# Patient Record
Sex: Female | Born: 1970 | Race: White | Hispanic: No | Smoking: Never smoker
Health system: Southern US, Community
[De-identification: ages and names within clinical notes are randomized; demographics above are authoritative.]

## PROBLEM LIST (undated history)

## (undated) DIAGNOSIS — E785 Hyperlipidemia, unspecified: Secondary | ICD-10-CM

## (undated) DIAGNOSIS — E039 Hypothyroidism, unspecified: Secondary | ICD-10-CM

## (undated) HISTORY — DX: Hyperlipidemia, unspecified: E78.5

## (undated) HISTORY — PX: CHOLECYSTECTOMY: SHX55

## (undated) HISTORY — PX: ENDOMETRIAL ABLATION: SHX621

## (undated) HISTORY — DX: Hypothyroidism, unspecified: E03.9

---

## 1998-09-27 ENCOUNTER — Other Ambulatory Visit: Admission: RE | Admit: 1998-09-27 | Discharge: 1998-09-27 | Payer: Self-pay | Admitting: Obstetrics and Gynecology

## 1999-08-06 ENCOUNTER — Emergency Department (HOSPITAL_COMMUNITY): Admission: EM | Admit: 1999-08-06 | Discharge: 1999-08-06 | Payer: Self-pay | Admitting: Emergency Medicine

## 1999-10-24 ENCOUNTER — Other Ambulatory Visit: Admission: RE | Admit: 1999-10-24 | Discharge: 1999-10-24 | Payer: Self-pay | Admitting: Physical Therapy

## 2000-05-17 ENCOUNTER — Inpatient Hospital Stay (HOSPITAL_COMMUNITY): Admission: AD | Admit: 2000-05-17 | Discharge: 2000-05-17 | Payer: Self-pay | Admitting: Obstetrics and Gynecology

## 2000-05-24 ENCOUNTER — Inpatient Hospital Stay (HOSPITAL_COMMUNITY): Admission: AD | Admit: 2000-05-24 | Discharge: 2000-05-27 | Payer: Self-pay | Admitting: Obstetrics and Gynecology

## 2000-06-14 ENCOUNTER — Encounter: Admission: RE | Admit: 2000-06-14 | Discharge: 2000-06-28 | Payer: Self-pay | Admitting: Obstetrics and Gynecology

## 2000-06-25 ENCOUNTER — Other Ambulatory Visit: Admission: RE | Admit: 2000-06-25 | Discharge: 2000-06-25 | Payer: Self-pay | Admitting: Obstetrics and Gynecology

## 2001-08-12 ENCOUNTER — Other Ambulatory Visit: Admission: RE | Admit: 2001-08-12 | Discharge: 2001-08-12 | Payer: Self-pay | Admitting: Obstetrics and Gynecology

## 2002-08-19 ENCOUNTER — Other Ambulatory Visit: Admission: RE | Admit: 2002-08-19 | Discharge: 2002-08-19 | Payer: Self-pay | Admitting: Obstetrics and Gynecology

## 2003-08-21 ENCOUNTER — Other Ambulatory Visit: Admission: RE | Admit: 2003-08-21 | Discharge: 2003-08-21 | Payer: Self-pay | Admitting: Obstetrics and Gynecology

## 2004-02-21 ENCOUNTER — Inpatient Hospital Stay (HOSPITAL_COMMUNITY): Admission: AD | Admit: 2004-02-21 | Discharge: 2004-02-21 | Payer: Self-pay | Admitting: Obstetrics and Gynecology

## 2004-02-27 ENCOUNTER — Inpatient Hospital Stay (HOSPITAL_COMMUNITY): Admission: AD | Admit: 2004-02-27 | Discharge: 2004-03-02 | Payer: Self-pay | Admitting: Obstetrics and Gynecology

## 2004-03-03 ENCOUNTER — Encounter: Admission: RE | Admit: 2004-03-03 | Discharge: 2004-04-02 | Payer: Self-pay | Admitting: Obstetrics and Gynecology

## 2004-03-07 ENCOUNTER — Inpatient Hospital Stay (HOSPITAL_COMMUNITY): Admission: AD | Admit: 2004-03-07 | Discharge: 2004-03-07 | Payer: Self-pay | Admitting: Obstetrics and Gynecology

## 2004-04-04 ENCOUNTER — Other Ambulatory Visit: Admission: RE | Admit: 2004-04-04 | Discharge: 2004-04-04 | Payer: Self-pay | Admitting: Obstetrics and Gynecology

## 2004-04-06 ENCOUNTER — Encounter: Admission: RE | Admit: 2004-04-06 | Discharge: 2004-04-06 | Payer: Self-pay | Admitting: Obstetrics and Gynecology

## 2004-08-05 ENCOUNTER — Encounter: Admission: RE | Admit: 2004-08-05 | Discharge: 2004-08-05 | Payer: Self-pay | Admitting: Obstetrics and Gynecology

## 2006-01-31 ENCOUNTER — Other Ambulatory Visit: Admission: RE | Admit: 2006-01-31 | Discharge: 2006-01-31 | Payer: Self-pay | Admitting: Obstetrics and Gynecology

## 2009-03-26 ENCOUNTER — Encounter: Admission: RE | Admit: 2009-03-26 | Discharge: 2009-03-26 | Payer: Self-pay | Admitting: Obstetrics and Gynecology

## 2010-05-06 ENCOUNTER — Ambulatory Visit (HOSPITAL_BASED_OUTPATIENT_CLINIC_OR_DEPARTMENT_OTHER): Admission: RE | Admit: 2010-05-06 | Discharge: 2010-05-06 | Payer: Self-pay | Admitting: Obstetrics and Gynecology

## 2010-05-06 ENCOUNTER — Ambulatory Visit: Payer: Self-pay | Admitting: Diagnostic Radiology

## 2011-02-24 NOTE — Op Note (Signed)
NAME:  Terri [name], Terri [name]                       ACCOUNT NO.:  [id number]   MEDICAL RECORD NO.:  [id number]                   PATIENT TYPE:  INP   LOCATION:  9112                                 FACILITY:  [facility]   PHYSICIAN:  Dineen Kid. Rana Snare, M.D.                 DATE OF BIRTH:  October 14, 1970   DATE OF PROCEDURE:  02/28/2004  DATE OF DISCHARGE:                                 OPERATIVE REPORT   PREOPERATIVE DIAGNOSES:  1. Intrauterine pregnancy at 39 weeks.  2. Arrest of dilation.  3. Occiput posterior presentation.   POSTOPERATIVE DIAGNOSES:  1. Intrauterine pregnancy at 39 weeks.  2. Arrest of dilation.  3. Occiput posterior presentation.   PROCEDURE:  Primary low segment transverse cesarean section.   SURGEON:  Dineen Kid. Rana Snare, M.D.   ANESTHESIA:  Epidural.   INDICATIONS:  Ms. Baine presented for amniotomy due to history of rapid  labor and group B strep positive and favorable cervix.  She rapidly  progressed to 8 cm, had arrest of dilation despite adequate contractions,  did not progress beyond this, was in the right occiput posterior  presentation at the -1 station at 8 cm.  Proceeded with primary low segment  transverse cesarean section for arrest of dilation.   FINDINGS AT THE TIME OF SURGERY:  A viable female infant, Apgars 8 and 9,  weight 9 pounds 2 ounces, pH arterial 7.36.   DESCRIPTION OF PROCEDURE:  After adequate analgesia, the patient was placed  in the supine position with left lateral tilt.  She was sterilely prepped  and draped, the bladder was sterilely drained.  A Pfannenstiel skin incision  was made two fingerbreadths from the pubic symphysis, taken down sharply to  fascia, which was incised transversely, extended superiorly and inferiorly  off the bellies of the rectus muscle, which were separated sharply in the  midline.  The peritoneum was entered sharply, a bladder flap was created and  placed behind the bladder blade.  A low segment myotomy incision was  made  down to the infant's vertex.  The infant was then delivered, cord clamped,  cut, and handed to the pediatricians for resuscitation.  Cord blood was  obtained, placenta extracted manually.  The uterus was exteriorized, wiped  clean with a dry lap.  The myotomy incision was closed in two layers, the  first being a running locking layer, the second being an imbricating layer  of 0 Monocryl suture.  The uterus was placed back in the peritoneal cavity  and after a copious amount of irrigation, adequate hemostasis was assured.  The peritoneum was closed with 0 Monocryl, rectus muscle plicated in the  midline, irrigation was applied, and after adequate hemostasis the fascia  was closed with #1 Vicryl in a running fashion.  Irrigation was applied and  after  adequate hemostasis, the skin was stapled, Steri-Strips applied.  The  patient tolerated the procedure well and was stable  on transfer to the  recovery room.  The sponge, needle, and instrument count was normal x3.  The  patient received 1 g of Cefoxitin after delivery of the placenta.                                               Dineen Kid Rana Snare, M.D.    DCL/MEDQ  D:  02/28/2004  T:  02/29/2004  Job:  [id number]

## 2011-02-24 NOTE — Discharge Summary (Signed)
NAME:  Terri [name], Terri [name]                       ACCOUNT NO.:  [id number]   MEDICAL RECORD NO.:  [id number]                   PATIENT TYPE:  INP   LOCATION:  9112                                 FACILITY:  [facility]   PHYSICIAN:  Duke Salvia. Marcelle Overlie, M.D.            DATE OF BIRTH:  Jul 16, 1971   DATE OF ADMISSION:  02/27/2004  DATE OF DISCHARGE:  03/02/2004                                 DISCHARGE SUMMARY   ADMITTING DIAGNOSES:  1. Intrauterine pregnancy at 39-and-a-half weeks estimated gestational age.  2. Induction of labor secondary to history of rapid labor.   DISCHARGE DIAGNOSES:  1. Status post low transverse cesarean section secondary to arrest of     dilatation and occiput posterior.  2. Viable female infant.   PROCEDURE:  Primary low transverse cesarean section.   REASON FOR ADMISSION:  Please see written H&P.   HOSPITAL COURSE:  The patient was a 40 year old gravida 2 para 1 that was  admitted to [facility] at 39-and-a-half weeks estimated  gestational age for an induction of labor.  The patient had history of a  rapid labor and she was also known to be positive for group B beta strep  with a favorable cervix.  On admission fetal heart tones were in the 140s  with accelerations.  Contractions were noted to be every 5-15 minutes.  Cervix was dilated to 3 cm, 50% effaced, vertex at a -3 station.  Artificial  rupture of membranes was performed without difficulty.  IV antibiotics were  given prophylactically for positive group B beta strep.  The patient did  progress to 8 cm but had an arrest of dilatation despite adequate  contractions and did not progress beyond this.  The vertex was noted to be  right occiput posterior at a -1 station.  The decision was made to proceed  with a primary low transverse cesarean section for arrest of dilatation.  The patient was then transferred to the operating room where epidural was  dosed to an adequate surgical level.  A low  transverse incision was made  with the delivery of a viable female infant weighing 9 pounds 2 ounces with  Apgars of 8 at one minute and 9 at five minutes.  Umbilical cord pH was  7.36.  The patient tolerated the procedure well and was taken to the  recovery room in stable condition.  On postoperative day #1 vital signs were  stable.  Fundus was firm and nontender.  She had good return of bowel  function.  Abdominal dressing was noted to be clean, dry, and intact.  Labs  revealed hemoglobin of 8.8.  The patient was started on some ferrous sulfate  when tolerating a regular diet.  On postoperative day #2 the patient was  afebrile, vital signs were stable.  Abdominal dressing had been removed  revealing an incision that was clean, dry, and intact.  The patient was  tolerating a regular  diet.  She was ambulating without assistance.  On  postoperative day #3 the patient was without complaint.  Vital signs were  stable; she was afebrile.  Incision was clean, dry, and intact.  Staples  were removed and the patient was discharged home.   CONDITION ON DISCHARGE:  Good.   DIET:  Regular as tolerated.   ACTIVITY:  No heavy lifting, no driving x2 weeks, no vaginal entry.   FOLLOW-UP:  The patient is to follow up in the office in 1-2 weeks for an  incision check.  She is to call for temperature greater than 100 degrees,  persistent nausea and vomiting, heavy vaginal bleeding, and/or redness or  drainage from the incisional site.   DISCHARGE MEDICATIONS:  1. Percocet 5/325 #30 one p.o. q.4-6h. p.r.n.  2. Motrin 600 mg q.6h.  3. Prenatal vitamins one p.o. daily.  4. Colace one p.o. daily p.r.n.  5. Ferrous sulfate 325 mg one p.o. b.i.d.     Julio Sicks, N.P.                        Richard M. Marcelle Overlie, M.D.    CC/MEDQ  D:  03/23/2004  T:  03/23/2004  Job:  [id number]

## 2011-03-22 ENCOUNTER — Other Ambulatory Visit: Payer: Self-pay | Admitting: Obstetrics and Gynecology

## 2011-03-22 DIAGNOSIS — N644 Mastodynia: Secondary | ICD-10-CM

## 2011-03-28 ENCOUNTER — Other Ambulatory Visit: Payer: Self-pay | Admitting: Obstetrics and Gynecology

## 2011-03-28 ENCOUNTER — Ambulatory Visit
Admission: RE | Admit: 2011-03-28 | Discharge: 2011-03-28 | Disposition: A | Discharge status: Home / Self Care | Payer: BC Managed Care – PPO | Source: Ambulatory Visit | Attending: Obstetrics and Gynecology | Admitting: Obstetrics and Gynecology

## 2011-03-28 DIAGNOSIS — N644 Mastodynia: Secondary | ICD-10-CM

## 2012-03-18 ENCOUNTER — Other Ambulatory Visit: Payer: Self-pay | Admitting: Obstetrics and Gynecology

## 2012-03-18 DIAGNOSIS — Z1231 Encounter for screening mammogram for malignant neoplasm of breast: Secondary | ICD-10-CM

## 2012-03-28 ENCOUNTER — Ambulatory Visit (HOSPITAL_BASED_OUTPATIENT_CLINIC_OR_DEPARTMENT_OTHER)
Admission: RE | Admit: 2012-03-28 | Discharge: 2012-03-28 | Disposition: A | Discharge status: Home / Self Care | Payer: BC Managed Care – PPO | Source: Ambulatory Visit | Attending: Obstetrics and Gynecology | Admitting: Obstetrics and Gynecology

## 2012-03-28 DIAGNOSIS — Z1231 Encounter for screening mammogram for malignant neoplasm of breast: Secondary | ICD-10-CM

## 2012-05-07 ENCOUNTER — Other Ambulatory Visit: Payer: Self-pay | Admitting: Family Medicine

## 2012-05-07 DIAGNOSIS — N6310 Unspecified lump in the right breast, unspecified quadrant: Secondary | ICD-10-CM

## 2012-05-10 ENCOUNTER — Other Ambulatory Visit: Payer: BC Managed Care – PPO

## 2012-05-16 ENCOUNTER — Ambulatory Visit
Admission: RE | Admit: 2012-05-16 | Discharge: 2012-05-16 | Disposition: A | Discharge status: Home / Self Care | Payer: BC Managed Care – PPO | Source: Ambulatory Visit | Attending: Family Medicine | Admitting: Family Medicine

## 2012-05-16 DIAGNOSIS — N6310 Unspecified lump in the right breast, unspecified quadrant: Secondary | ICD-10-CM

## 2012-08-01 ENCOUNTER — Other Ambulatory Visit (HOSPITAL_COMMUNITY): Payer: Self-pay | Admitting: Obstetrics and Gynecology

## 2012-08-01 DIAGNOSIS — R1031 Right lower quadrant pain: Secondary | ICD-10-CM

## 2012-08-06 ENCOUNTER — Ambulatory Visit (HOSPITAL_COMMUNITY)
Admission: RE | Admit: 2012-08-06 | Discharge: 2012-08-06 | Disposition: A | Discharge status: Home / Self Care | Payer: BC Managed Care – PPO | Source: Ambulatory Visit | Attending: Obstetrics and Gynecology | Admitting: Obstetrics and Gynecology

## 2012-08-06 ENCOUNTER — Ambulatory Visit (HOSPITAL_COMMUNITY): Payer: BC Managed Care – PPO

## 2012-08-06 DIAGNOSIS — J9819 Other pulmonary collapse: Secondary | ICD-10-CM | POA: Insufficient documentation

## 2012-08-06 DIAGNOSIS — R1031 Right lower quadrant pain: Secondary | ICD-10-CM | POA: Insufficient documentation

## 2012-08-06 MED ORDER — IOHEXOL 300 MG/ML  SOLN
100.0000 mL | Freq: Once | INTRAMUSCULAR | Status: AC | PRN
  Administered 2012-08-06: 100 mL via INTRAVENOUS

## 2014-02-14 IMAGING — CT CT ABD-PELV W/ CM
1 of 3 series · 14 of 32 positions shown, 19 images · IV contrast (OMNIPAQUE)
Comparison: None

CLINICAL DATA: Right lower quadrant abdominal pain.

CT ABDOMEN AND PELVIS WITH CONTRAST
TECHNIQUE: Multidetector CT imaging of the abdomen and pelvis was
performed following the standard protocol during bolus
administration of intravenous contrast.
Contrast: 100mL OMNIPAQUE IOHEXOL 300 MG/ML  SOLN

[Series 2: routine abdomen/pelvis with · axial · 0.79mm/px · z∈[-622,-222]mm · 14 of 92 slices shown, 19 images]
[im 6/92  soft-tissue]
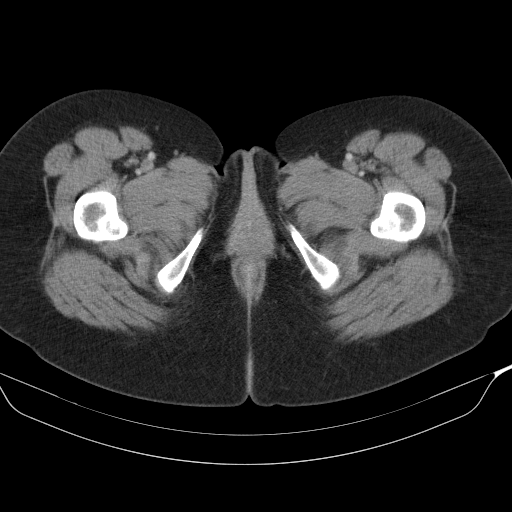
[im 6/92  bone]
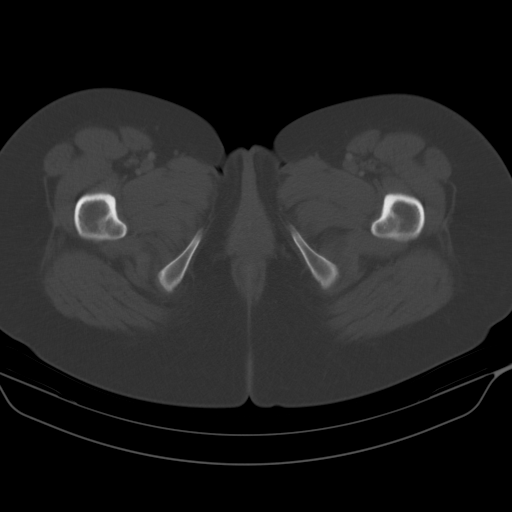
[im 11/92  soft-tissue]
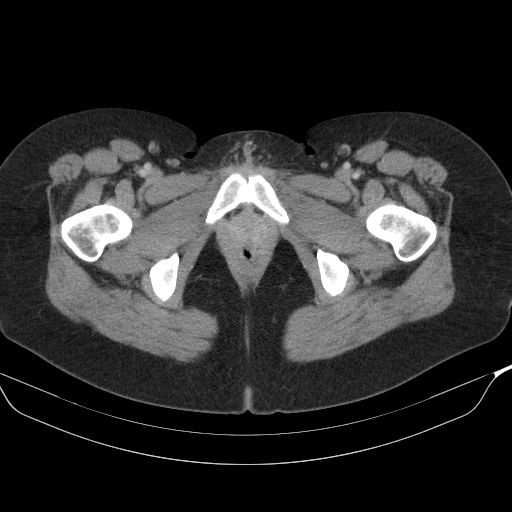
[im 21/92  soft-tissue]
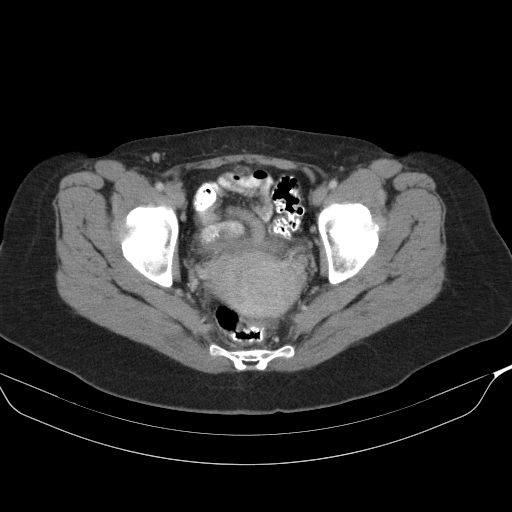
[im 26/92  soft-tissue]
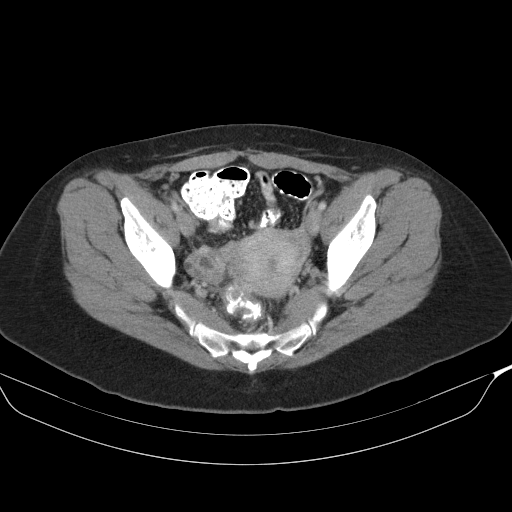
[im 31/92  soft-tissue]
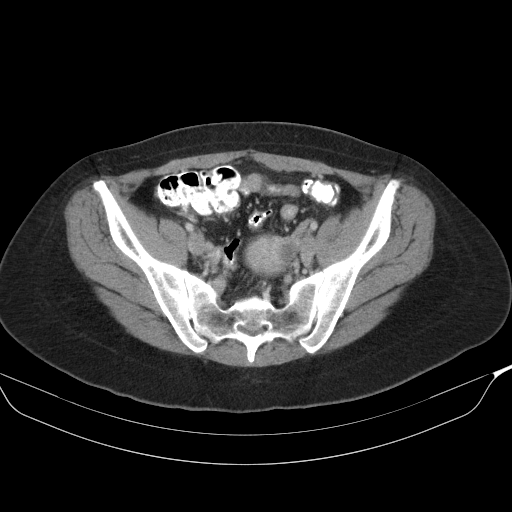
[im 41/92  soft-tissue]
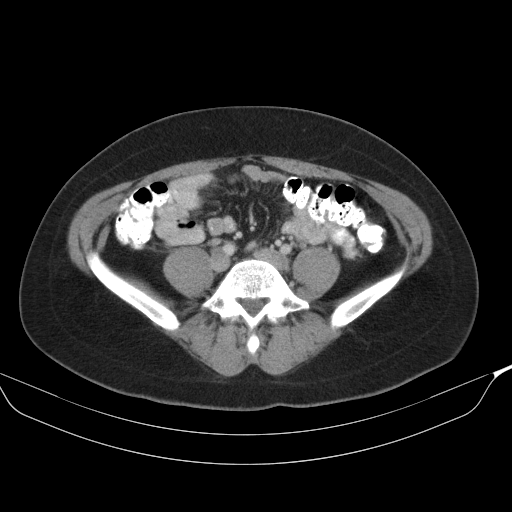
[im 46/92  soft-tissue]
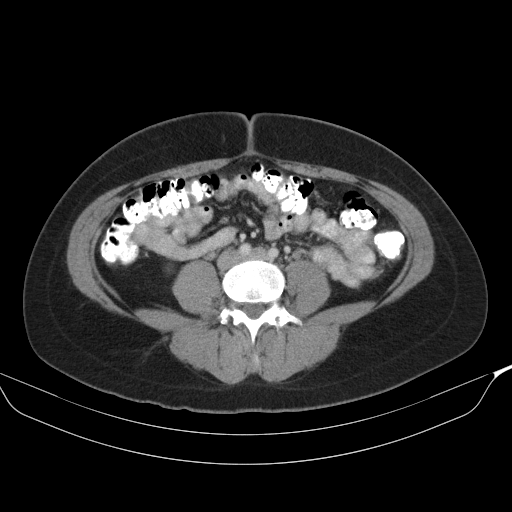
[im 51/92  soft-tissue]
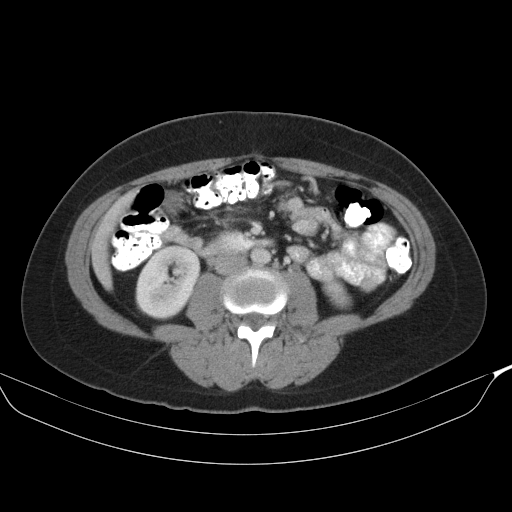
[im 61/92  soft-tissue]
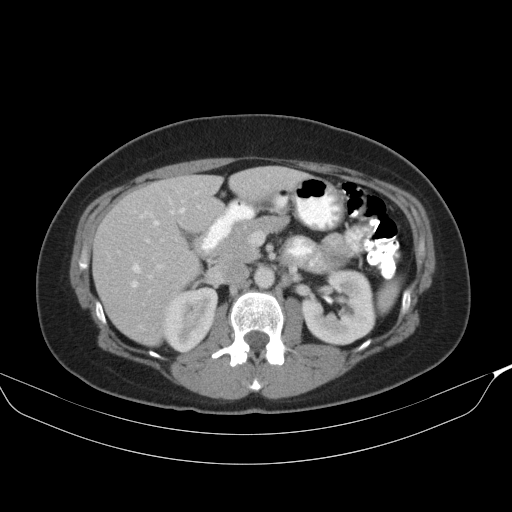
[im 61/92  bone]
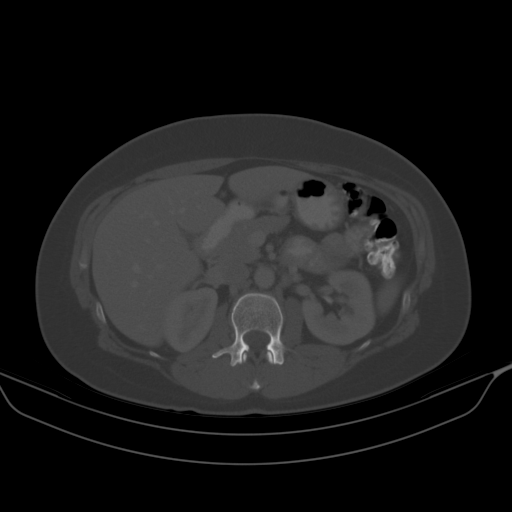
[im 66/92  soft-tissue]
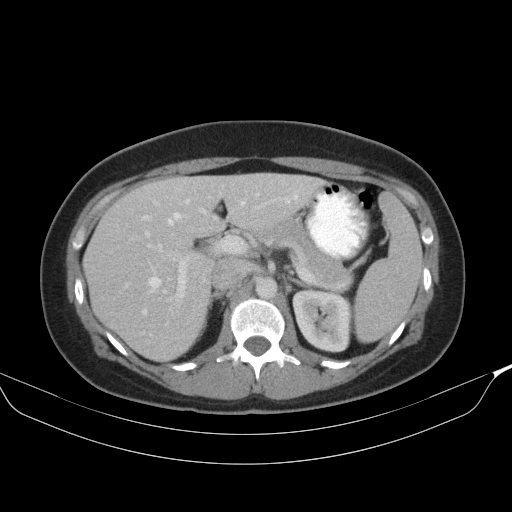
[im 71/92  soft-tissue]
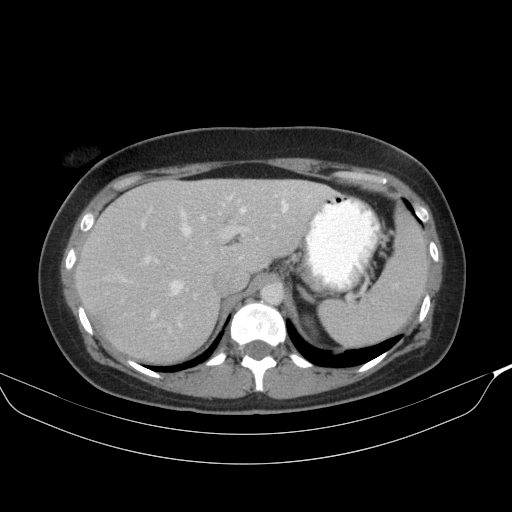
[im 71/92  lung]
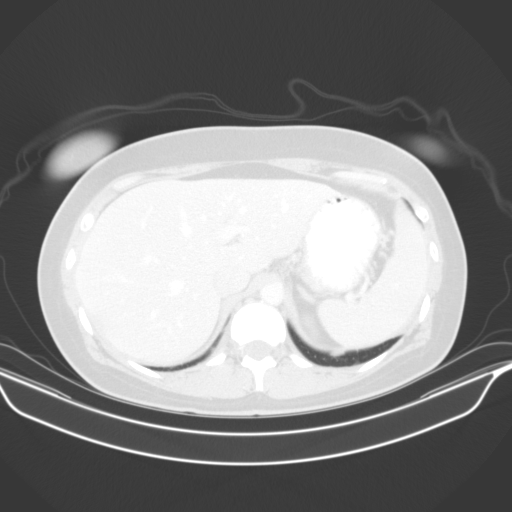
[im 76/92  lung]
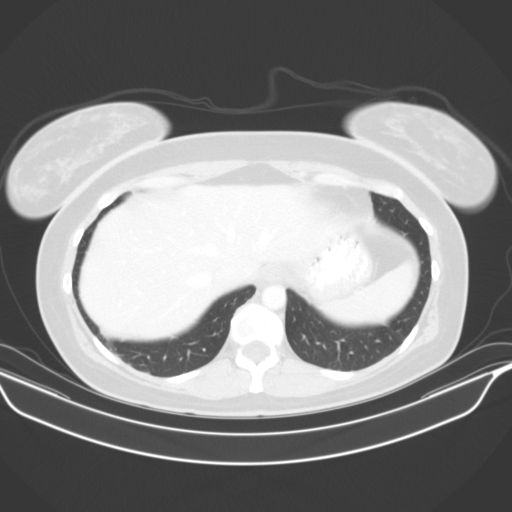
[im 81/92  soft-tissue]
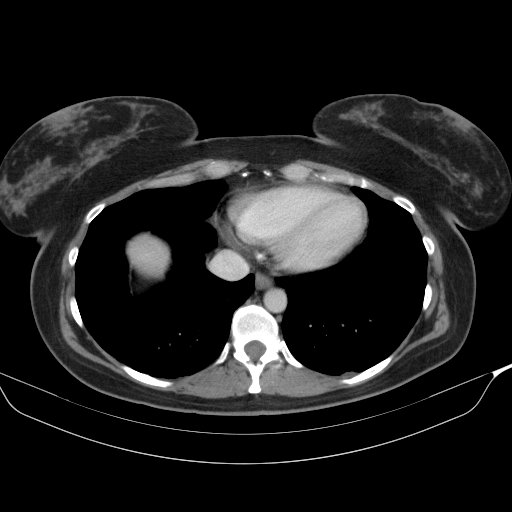
[im 81/92  lung]
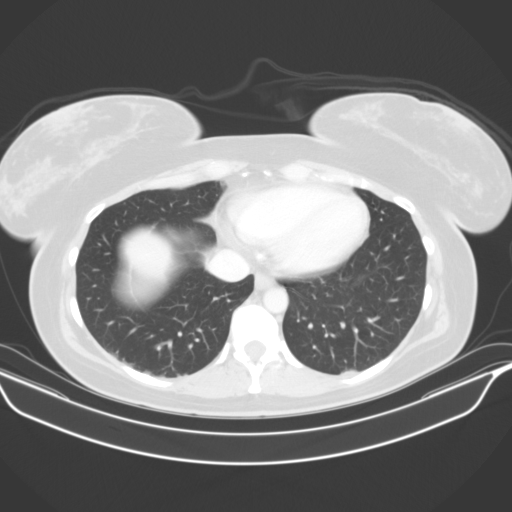
[im 86/92  soft-tissue]
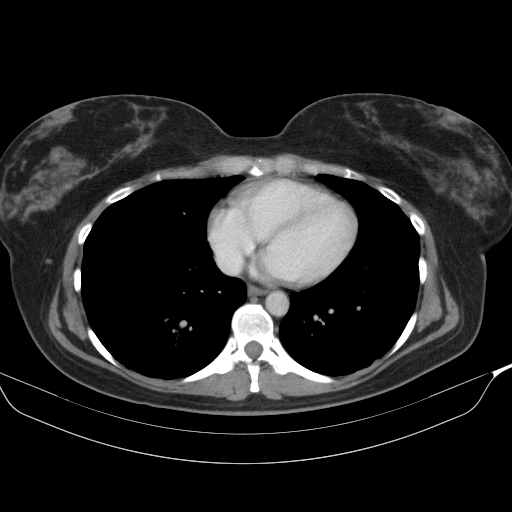
[im 86/92  lung]
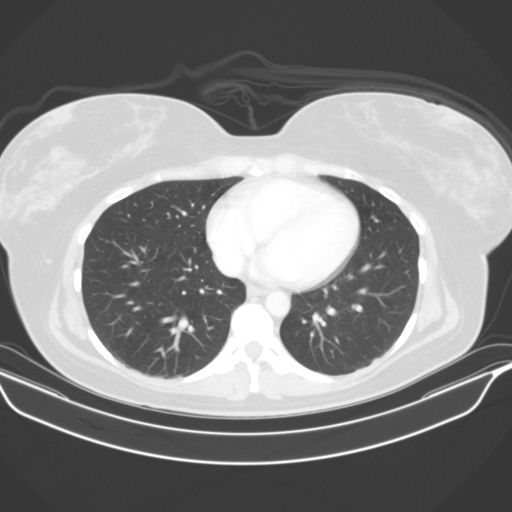

[14 of 32 positions shown; findings below may reference images not displayed]

FINDINGS: The lung bases are clear.  Minimal dependent subpleural
atelectasis.  The heart is normal in size.  No pericardial
effusion.  No hiatal hernia.

The liver is normal.  No focal lesions or intrahepatic biliary
dilatation.  The portal and hepatic veins are patent.  The
gallbladder is normal.  No common bile duct dilatation.  The
pancreas is normal.  The spleen is normal in size.  No focal
lesions.  The adrenal glands and kidneys are normal.

The stomach, duodenum, small bowel and colon are unremarkable.  The
terminal ileum is normal.  The appendix is not identified for
certain but no findings to suggest appendicitis.  No mesenteric or
retroperitoneal mass or adenopathy.  Small scattered lymph nodes
are noted.  The aorta is normal in caliber.  The major branch
vessels are normal.

The uterus and ovaries are unremarkable.  Small bilateral ovarian
cysts are noted.  No pelvic mass, adenopathy or free pelvic fluid
collections.  No inguinal mass or hernia.

The bony structures are unremarkable.
IMPRESSION: Unremarkable CT examination of the abdomen/pelvis.  No acute
abdominal/pelvic findings, mass lesions or lymphadenopathy.

## 2016-02-11 DIAGNOSIS — E785 Hyperlipidemia, unspecified: Secondary | ICD-10-CM | POA: Insufficient documentation

## 2016-02-11 DIAGNOSIS — E039 Hypothyroidism, unspecified: Secondary | ICD-10-CM | POA: Insufficient documentation

## 2016-02-11 DIAGNOSIS — G4700 Insomnia, unspecified: Secondary | ICD-10-CM | POA: Insufficient documentation

## 2016-02-11 DIAGNOSIS — F419 Anxiety disorder, unspecified: Secondary | ICD-10-CM | POA: Insufficient documentation

## 2016-02-11 DIAGNOSIS — F411 Generalized anxiety disorder: Secondary | ICD-10-CM | POA: Insufficient documentation

## 2016-02-11 DIAGNOSIS — N6489 Other specified disorders of breast: Secondary | ICD-10-CM | POA: Insufficient documentation

## 2016-02-11 DIAGNOSIS — K219 Gastro-esophageal reflux disease without esophagitis: Secondary | ICD-10-CM | POA: Insufficient documentation

## 2016-02-11 DIAGNOSIS — M5412 Radiculopathy, cervical region: Secondary | ICD-10-CM | POA: Insufficient documentation

## 2016-02-14 DIAGNOSIS — E781 Pure hyperglyceridemia: Secondary | ICD-10-CM | POA: Insufficient documentation

## 2017-03-12 ENCOUNTER — Other Ambulatory Visit: Payer: Self-pay | Admitting: General Surgery

## 2018-04-04 LAB — HM MAMMOGRAPHY

## 2018-04-05 LAB — HM PAP SMEAR

## 2018-04-05 LAB — LAB REPORT - SCANNED
HM HIV Screening: NEGATIVE
HM Hepatitis Screen: NEGATIVE

## 2018-06-20 LAB — LAB REPORT - SCANNED
A1c: 5.4
EGFR: 76

## 2019-01-13 DIAGNOSIS — J302 Other seasonal allergic rhinitis: Secondary | ICD-10-CM | POA: Insufficient documentation

## 2019-05-21 LAB — HM MAMMOGRAPHY

## 2020-06-07 LAB — HM MAMMOGRAPHY

## 2020-06-08 LAB — LAB REPORT - SCANNED
A1c: 5.5
EGFR: 100

## 2021-08-17 LAB — HM MAMMOGRAPHY

## 2021-08-18 LAB — LAB REPORT - SCANNED: EGFR (Non-African Amer.): 103

## 2021-08-18 LAB — HEMOGLOBIN A1C: A1c: 5.4

## 2022-08-23 LAB — HM MAMMOGRAPHY

## 2022-08-24 LAB — LAB REPORT - SCANNED
A1c: 14.1
HM HIV Screening: NEGATIVE
HM Hepatitis Screen: NEGATIVE

## 2022-08-24 LAB — HM PAP SMEAR

## 2022-08-31 LAB — CBC
Basophils Absolute: 0
Eosinophil %: 1.6
HCT: 39 (ref 29–41)
HGB: 13.1
Lymphocyte %: 36.5
MCH: 31.7
MCV: 93.8 (ref 76–111)
MONO#: 0.5
Neutrophil %: 54.4
Platelet: 283
RBC: 4.14 (ref 3.87–5.11)
RDW: 13.2
WBC: 7
lymph#: 2.6

## 2022-08-31 LAB — LIPID PANEL
Cholesterol: 199
HDL: 51
LDL Chol Calc (NIH): 104
Triglycerides: 222 — AB (ref 40–160)
VLDL: 44 mg/dL

## 2022-08-31 LAB — VITAMIN D, 1,25 + 25-HYDROXY: Vitamin D, 1, 25-Dihydroxy: 43.6

## 2022-08-31 LAB — COMPLETE METABOLIC PANEL WITH GFR
Albumin: 3.8
Alkaline Phosphatase: 57
BUN: 10
CALCIUM: 9.5
Chloride: 101
Creat: 0.7
Glucose: 88
Potassium: 4.3
Total Protein: 6.3 — AB (ref 6.4–8.2)
eGFR: 103.8

## 2022-08-31 LAB — TSH+T3+FREE T4+T3 FREE
Free T3: 3.54
Free T4: 0.9
TSH: 3.12

## 2022-12-03 LAB — LIPID PANEL
Chol/HDL Ratio: 3.59
HDL: 51
LDL: 91
Non-HDL Cholesterol: 132

## 2022-12-03 LAB — TSH+T3+FREE T4+T3 FREE
Free T3: 3.19
Free T4: 1
TSH: 2.27

## 2022-12-03 LAB — COMPLETE METABOLIC PANEL WITHOUT GFR
ALT(SGPT): 17 (ref 3–30)
AST: 22
Albumin: 3.6
Alkaline Phosphatase: 67
BUN/Creatinine Ratio: 8
BUN: 6
CALCIUM: 8.7
CO2: 27
Chloride: 101
Creat: 0.8
Globulin: 3.2
Glucose: 104
Potassium: 4.2
Sodium: 139 (ref 137–147)
Total Bilirubin: 0.3
Total Protein: 6.8 (ref 6.4–8.2)
eGFR: 88.3

## 2022-12-03 LAB — VITAMIN B12: VITAMIN B12: 282

## 2023-02-07 ENCOUNTER — Encounter: Payer: Self-pay | Admitting: Family Medicine

## 2023-02-07 ENCOUNTER — Ambulatory Visit: Payer: BC Managed Care – PPO | Admitting: Family Medicine

## 2023-02-07 VITALS — BP 106/71 | HR 81 | Temp 98.0°F | Ht 63.0 in | Wt 153.8 lb

## 2023-02-07 DIAGNOSIS — E039 Hypothyroidism, unspecified: Secondary | ICD-10-CM | POA: Diagnosis not present

## 2023-02-07 DIAGNOSIS — Z7689 Persons encountering health services in other specified circumstances: Secondary | ICD-10-CM | POA: Insufficient documentation

## 2023-02-07 DIAGNOSIS — E785 Hyperlipidemia, unspecified: Secondary | ICD-10-CM

## 2023-02-07 DIAGNOSIS — E538 Deficiency of other specified B group vitamins: Secondary | ICD-10-CM | POA: Insufficient documentation

## 2023-02-07 DIAGNOSIS — L299 Pruritus, unspecified: Secondary | ICD-10-CM | POA: Diagnosis not present

## 2023-02-07 DIAGNOSIS — R202 Paresthesia of skin: Secondary | ICD-10-CM | POA: Insufficient documentation

## 2023-02-07 DIAGNOSIS — F419 Anxiety disorder, unspecified: Secondary | ICD-10-CM | POA: Diagnosis not present

## 2023-02-07 NOTE — Progress Notes (Signed)
New Patient Office Visit  Subjective    Patient ID: Terri [name], female    DOB: 09/01/1971  Age: 52 y.o. MRN: [id number]  CC:  Chief Complaint  Patient presents with   Establish Care   Tingling    Last month 4 episodes of itching and tingling that start in hands and goes up both arms to neck and face. She also states she had this feeling in her back. It wakes her up in the middle of the night.     HPI Terri [name] presents to establish care with this practice. She is new to me. In 2019, she was having GI/gallbladder issues with [facility]. She was not happy with care and switched to holistic provider. Sees Dr. Rana Snare GYN who has been taking care of thyroid and hormones. Exercises daily with good tolerance. No chest pain, no shortness of breath.   Itching and tingling:  Has had 4 episodes in the past month of itching and tingling that starts in the hands that radiates to arms and face that wakes her up at night. The first time it happened she felt extremely hot. Started after dropping heavy object onto right foot. When this starts it lasts for several minutes to one hour. Recently had an allergic reaction to soap that resulted in rash and itching. Took Benadryl once which did help. Does not think this is related to an allergic reaction. Denies respiratory issues and facial swelling, it is a painful tingling. Stress is not issue. She is retired now, in June. Much less stress. Has not identified any changes in environment. Last episode was last week.   Dr. Rana Snare has been monitoring her thyroid function and prescribing hormonal replacement. She is up to date on pap, mammogram, and colonoscopy. Will get records sent to this practice.   History reviewed.  HLD: on niacin which has helped. No longer on statin therapy.   Hypothyroid: on levothyroxine 50 mcg daily.   Anxiety: on Wellbutrin 150 mg, symptoms are well controlled. Reports symptoms are worse in the winter usually.   History of  low Vitamin B 12: on treatment. Will likely check levels in near future once results are sent from previous provider.   Taking multiple supplements.   Chart review per Care Everywhere:  Last visible labs done 12/23/17. She will get recent lab work sent to office. Coloscopy on 07/02/20: unable to verify results. Reports normal. Will get results sent.  08/23/22: had STD blood work done. Unable to verify results, Reports all normal.     Outpatient Encounter Medications as of 02/07/2023  Medication Sig   Ascorbic Acid (VITAMIN C) 1000 MG tablet Take 1,000 mg by mouth daily.   Bacillus Coagulans-Inulin (PROBIOTIC) 1-250 BILLION-MG CAPS Take by mouth.   buPROPion (WELLBUTRIN XL) 150 MG 24 hr tablet Take 150 mg by mouth daily.   calcium carbonate (OS-CAL) 1250 (500 Ca) MG chewable tablet Chew 1 tablet by mouth.   Calcium-Magnesium-Vitamin D (CITRACAL CALCIUM+D) 600-40-500 MG-MG-UNIT TB24 Take by mouth.   estradiol (ESTRACE) 2 MG tablet Take 2 mg by mouth daily.   Ferrous Fumarate (IRON) 18 MG TBCR Take by mouth.   levothyroxine (SYNTHROID) 50 MCG tablet Take 50 mcg by mouth daily before breakfast.   MAGNESIUM PO Take by mouth. 1350mg    Methylcobalamin (METHYL B-12) 5000 MCG CHEW Chew by mouth.   Multiple Vitamin (MULTIVITAMIN) tablet Take 1 tablet by mouth daily.   niacin 500 MG CR capsule Take 500 mg by mouth at bedtime.  progesterone (PROMETRIUM) 100 MG capsule Take 100 mg by mouth daily.   ALPRAZolam (XANAX) 0.5 MG tablet Take 0.5 mg by mouth at bedtime as needed. (Patient not taking: Reported on 02/07/2023)   No facility-administered encounter medications on file as of 02/07/2023.    Past Medical History:  Diagnosis Date   HLD (hyperlipidemia)    Hypothyroidism     Past Surgical History:  Procedure Laterality Date   CESAREAN SECTION     CHOLECYSTECTOMY      Family History  Problem Relation Age of Onset   Heart attack Father     Social History   Socioeconomic History   Marital  status: Divorced    Spouse name: Not on file   Number of children: 2   Years of education: Not on file   Highest education level: Not on file  Occupational History   Not on file  Tobacco Use   Smoking status: Never   Smokeless tobacco: Never  Substance and Sexual Activity   Alcohol use: Not Currently   Drug use: Never   Sexual activity: Yes    Partners: Male  Other Topics Concern   Not on file  Social History Narrative   Not on file   Social Determinants of Health   Financial Resource Strain: Not on file  Food Insecurity: Not on file  Transportation Needs: Not on file  Physical Activity: Not on file  Stress: Not on file  Social Connections: Not on file  Intimate Partner Violence: Not on file    Review of Systems  Respiratory:  Negative for shortness of breath.   Cardiovascular:  Negative for chest pain.  Gastrointestinal:  Negative for nausea and vomiting.  Neurological:  Positive for tingling. Negative for dizziness, weakness and headaches.        Objective    BP 106/71   Pulse 81   Temp 98 F (36.7 C) (Oral)   Ht 5\' 3"  (1.6 m)   Wt 153 lb 12.8 oz (69.8 kg)   LMP  (LMP Unknown)   SpO2 99%   BMI 27.24 kg/m   Physical Exam Vitals and nursing note reviewed.  Constitutional:      General: She is not in acute distress.    Appearance: Normal appearance.  Cardiovascular:     Rate and Rhythm: Regular rhythm.     Pulses: Normal pulses.     Heart sounds: Normal heart sounds.  Pulmonary:     Effort: Pulmonary effort is normal.     Breath sounds: Normal breath sounds.  Skin:    General: Skin is warm and dry.     Capillary Refill: Capillary refill takes less than 2 seconds.  Neurological:     General: No focal deficit present.     Mental Status: She is alert. Mental status is at baseline.  Psychiatric:        Mood and Affect: Mood normal.        Behavior: Behavior normal.        Thought Content: Thought content normal.        Judgment: Judgment normal.         Assessment & Plan:   Problem List Items Addressed This Visit     Anxiety disorder   Relevant Medications   ALPRAZolam (XANAX) 0.5 MG tablet   buPROPion (WELLBUTRIN XL) 150 MG 24 hr tablet   Hyperlipidemia   Hypothyroidism   Relevant Medications   levothyroxine (SYNTHROID) 50 MCG tablet   Pruritic condition   Establishing care  with new doctor, encounter for - Primary   Tingling sensation   Vitamin B 12 deficiency  Agrees with plan of care discussed.  Questions answered. She will get recent labs and medical records sent to this office. She will keep a journal of details of her day whenever she has episodes of itching and tingling. Will likely repeat Vitamin B 12 levels after results are received. She is currently on treatment for Vit B12 deficiency.  Next steps to be determined. Agrees to gather more data. She will follow-up in 4 weeks, sooner if needed.    Return in about 4 weeks (around 03/07/2023) for follow-up for tingling .   Novella Olive, FNP

## 2023-02-07 NOTE — Addendum Note (Signed)
Addended by: Janeece Riggers D on: 02/07/2023 01:58 PM   Modules accepted: Orders

## 2023-02-16 ENCOUNTER — Encounter: Payer: Self-pay | Admitting: Family Medicine

## 2023-02-16 ENCOUNTER — Other Ambulatory Visit: Payer: Self-pay | Admitting: Family Medicine

## 2023-02-26 ENCOUNTER — Telehealth: Payer: Self-pay | Admitting: Family Medicine

## 2023-02-26 NOTE — Telephone Encounter (Signed)
Patient called again to state that she needs her upcoming appointment to be with Dr. Wyline Mood because insurance states that they will not pay for a visit with PCP. Inquired with patient if insurance will cover a visit with Dr. Wyline Mood. Informed patient that if they will, a TOC request can be placed. Patient states that she needs to see a primary care physician. Attempted to explain to patient that both our providers are primary care physicians but insurance many not have them listed the same, to which patient hung up again. Katha Hamming

## 2023-02-26 NOTE — Telephone Encounter (Signed)
Patient called upset. Patient states that her insurance company has charged her an extra $70 dollars and she is required to pay it. Her insurance company states that she saw Dorena Bodo, who is a specialist in their system. Patient states front desk lied to her about her copay (card states $10 for PCP). Informed patient that provider is in network with [employer] (per Haskell Riling) and that the listing of the provider as a specialist is incorrect. Patient demanded we fix it and was advised that since billing has already been contacted, there is little this office can do. Patient hung up. Katha Hamming

## 2023-02-27 NOTE — Telephone Encounter (Signed)
Please see Pts issue she is having

## 2023-03-07 ENCOUNTER — Ambulatory Visit: Payer: BC Managed Care – PPO | Admitting: Family Medicine

## 2023-03-07 ENCOUNTER — Encounter: Payer: Self-pay | Admitting: Family Medicine

## 2023-03-07 VITALS — BP 119/79 | HR 78 | Temp 97.8°F | Ht 63.0 in | Wt 152.5 lb

## 2023-03-07 DIAGNOSIS — F32A Depression, unspecified: Secondary | ICD-10-CM | POA: Diagnosis not present

## 2023-03-07 DIAGNOSIS — L299 Pruritus, unspecified: Secondary | ICD-10-CM

## 2023-03-07 DIAGNOSIS — R202 Paresthesia of skin: Secondary | ICD-10-CM | POA: Diagnosis not present

## 2023-03-07 MED ORDER — CETIRIZINE HCL 10 MG PO TABS
10.0000 mg | ORAL_TABLET | Freq: Every day | ORAL | 11 refills | Status: DC
Start: 2023-03-07 — End: 2023-10-01

## 2023-03-07 NOTE — Progress Notes (Signed)
Established Patient Office Visit  Subjective   Patient ID: Terri [name], female    DOB: 1970/10/31  Age: 52 y.o. MRN: [id number]  Chief Complaint  Patient presents with   Tingling    Face started to itch 1 week ago at work. She describes it as "sunburned". She states since then it has decreased some. Her face feels dry and tight and she is having trouble finding lotions that don't "sting". She does have bumps that appear.   Depression    Increased Wellbutrin to 300 mg and complains of nausea and dry mouth, but does not want to taper back down.     HPI  Presents today to follow-up for tingling and itching that starts in the hands and goes up both arms and to neck and face. It had happened 4 times last month. Today she reports these symptoms have not been as bad as before, went 2 weeks without symptoms. Had feelings of exhaustion. Facial itching on 03/01/23 that continued all day, felt like  a "sunburn"- there is no visible rash.  On 5/25 face felt "tight" and felt dry but did not look dry. Applied thick night cream which helped with the tight feeling. Tried benadryl for symptoms that did not seem to help the itching.   Denies diet and medication changes that could account for pruritus.   Increased Wellbutrin to 300 mg and now reports nausea in morning, has been taking 300 mg at one time. Recommend taking 150 mg twice per day instead of 300 mg at once. Wellbutrin is prescribed by another provider, should discuss this with prescribing provider.   Review of Systems  Respiratory:  Negative for shortness of breath.   Cardiovascular:  Negative for chest pain.  Gastrointestinal:  Positive for nausea (changed dose of mediation). Negative for vomiting.  Skin:  Positive for itching.  Neurological:  Negative for dizziness, weakness and headaches.      Objective:     BP 119/79   Pulse 78   Temp 97.8 F (36.6 C) (Oral)   Ht 5\' 3"  (1.6 m)   Wt 152 lb 8 oz (69.2 kg)   LMP  (LMP Unknown)    SpO2 98%   BMI 27.01 kg/m  BP Readings from Last 3 Encounters:  03/07/23 119/79  02/07/23 106/71      Physical Exam Vitals and nursing note reviewed.  Constitutional:      Appearance: Normal appearance.  Cardiovascular:     Rate and Rhythm: Normal rate and regular rhythm.  Pulmonary:     Effort: Pulmonary effort is normal.     Breath sounds: Normal breath sounds.  Skin:    General: Skin is warm and dry.     Findings: No erythema or rash.  Neurological:     General: No focal deficit present.     Mental Status: She is alert. Mental status is at baseline.  Psychiatric:        Mood and Affect: Mood normal.        Behavior: Behavior normal.        Thought Content: Thought content normal.     Results for orders placed or performed in visit on 03/07/23  Lab report - scanned  Result Value Ref Range   EGFR (Non-African Amer.) 103   Hemoglobin A1c  Result Value Ref Range   A1c 5.4       The ASCVD Risk score (Arnett DK, et al., 2019) failed to calculate for the following reasons:  Cannot find a previous HDL lab   Cannot find a previous total cholesterol lab    Assessment & Plan:   Problem List Items Addressed This Visit     Pruritic condition - Primary   Relevant Medications   cetirizine (ZYRTEC) 10 MG tablet   Other Relevant Orders   B12 and Folate Panel   Ambulatory referral to Allergy   Tingling sensation   Relevant Medications   cetirizine (ZYRTEC) 10 MG tablet   Other Relevant Orders   B12 and Folate Panel   Ambulatory referral to Allergy   Depression  Will get labs today. No visible rash to assess. Symptoms occur intermittently. No shortness of breath or facial swelling. She will try cetirizine 10 mg daily while awaiting appointment with allergy specialist for further evaluation.   Recommend discussing Wellbutrin with prescribing provider about dose increase.   Return in about 23 weeks (around 08/15/2023) for cpe with labs .    Novella Olive,  FNP

## 2023-03-08 LAB — B12 AND FOLATE PANEL
Folate: >20 ng/mL (ref >3.0)
Vitamin B-12: >2000 pg/mL — ABNORMAL HIGH (ref 232–1245)

## 2023-03-13 ENCOUNTER — Other Ambulatory Visit: Payer: Self-pay | Admitting: Family Medicine

## 2023-03-13 LAB — COMPLETE METABOLIC PANEL WITH GFR
ALT(SGPT): 15 (ref 3–30)
AST: 19
CO2: 29
Globulin: 2.5
Sodium: 138 (ref 137–147)
Total Bilirubin: 0.5

## 2023-03-13 LAB — CBC
BASO%: 1 %
Eosinophil #: 0.1
MCHC: 33.8
MONO%: 7 %
MPV: 9.4 fL (ref 7.8–11.0)
NEUT#: 3.8

## 2023-03-13 LAB — LIPID PANEL
Chol/HDL Ratio: 3.9
Cholesterol: 183
LDL/HDL Ratio: 1.8
LDL/HDL Ratio: 2
Non-HDL Cholesterol: 148
Triglycerides: 204 — AB (ref 40–160)
VLDL: 41 mg/dL

## 2023-03-13 NOTE — Progress Notes (Signed)
Labs entered from 12/01/22

## 2023-03-13 NOTE — Progress Notes (Signed)
External labs entered from 08/30/22.

## 2023-04-18 ENCOUNTER — Encounter: Payer: Self-pay | Admitting: Internal Medicine

## 2023-04-18 ENCOUNTER — Ambulatory Visit: Payer: BC Managed Care – PPO | Admitting: Internal Medicine

## 2023-04-18 VITALS — BP 104/60 | HR 67 | Temp 97.7°F | Resp 16 | Ht 63.5 in | Wt 156.8 lb

## 2023-04-18 DIAGNOSIS — G629 Polyneuropathy, unspecified: Secondary | ICD-10-CM | POA: Diagnosis not present

## 2023-04-18 DIAGNOSIS — J302 Other seasonal allergic rhinitis: Secondary | ICD-10-CM | POA: Diagnosis not present

## 2023-04-18 DIAGNOSIS — L299 Pruritus, unspecified: Secondary | ICD-10-CM

## 2023-04-18 DIAGNOSIS — J3089 Other allergic rhinitis: Secondary | ICD-10-CM | POA: Diagnosis not present

## 2023-04-18 NOTE — Progress Notes (Signed)
New Patient Note  RE: Terri [name] MRN: [id number] DOB: Jun 26, 1971 Date of Office Visit: 04/18/2023  Consult requested by: Novella Olive, FNP Primary care provider: Novella Olive, FNP  Chief Complaint: Allergic Reaction (Pt states tingling starting in the fingers that travels to the arm, shoulders, neck, chest. Which turns into itching. Ongoing for about 6 months. Started severally, took benadryl. Can't use soap.)  History of Present Illness: I had the pleasure of seeing Terri [name] for initial evaluation at the [facility] on 04/18/2023. She is a 52 y.o. female, who is referred here by Novella Olive, FNP for the evaluation of pruritus.  History obtained from patient, chart review.  Rash: started 6 months ago, occurring intermittently, initially starting at night and would wake her up from pain or scratching.  Associates no skin changes or rashes  Denies other systemic symptoms including no respiratory, gastrointestinal or cardiovascular distress. Can only use goats milk based soap  Therapies tried: benadryl ( no benefit)  Potential triggers: unknown  Does not associate fever, joint pain, joint swelling, weight loss.  Lesions resolve in minues  No  bruising on resolution. No  recent illness. Describes symptoms as starting in her fingers and traveling up through her arms to her shoulders.  Lasted for minutes.  Unsure positioning changes symptoms.  She does feel like she has some weakness in her upper extremities.  She has not been seen by neurology.    Assessment and Plan: Terri [name] is a 52 y.o. female with: Pruritus  Seasonal and perennial allergic rhinitis  Neuropathy - Plan: Ambulatory referral to Neurology  Low suspicion symptoms are due to an allergic or histaminergic nature.  Sound more neuropathic.  Will refer to neurology. Plan: Patient Instructions  Neuropath/Pruritus  - Low suspicion this is due to to an allergic reaction -We will  refer you to neurology our admin team will help reach out to you to establish this appointment  Chronic Rhinitis Seasonal and Perennial Allergic: - allergy testing today: Positive to weed pollen and dust mite  - Prevention:  - allergen avoidance when possible - consider allergy shots as long term control of your symptoms by teaching your immune system to be more tolerant of your allergy triggers  - Symptom control: - Consider Nasal Steroid Spray: Best results if used daily. - Options include Flonase (fluticasone), Nasocort (triamcinolone), Nasonex (mometasome) 1- 2 sprays in each nostril daily.  - All can be purchased over-the-counter if not covered by insurance. - Consider Antihistamine: daily or daily as needed.   -Options include Zyrtec (Cetirizine) 10mg , Claritin (Loratadine) 10mg , Allegra (Fexofenadine) 180mg , or Xyzal (Levocetirinze) 5mg  - Can be purchased over-the-counter if not covered by insurance.  Follow up: as needed   Thank you so much for letting me partake in your care today.  Don't hesitate to reach out if you have any additional concerns!  Ferol Luz, MD  [facility] [place] [place]  Reducing Pollen Exposure  The American Academy of Allergy, Asthma and Immunology suggests the following steps to reduce your exposure to pollen during allergy seasons.    Do not hang sheets or clothing out to dry; pollen may collect on these items. Do not mow lawns or spend time around freshly cut grass; mowing stirs up pollen. Keep windows closed at night.  Keep car windows closed while driving. Minimize morning activities outdoors, a time when pollen counts are usually at their highest. Stay indoors as much as possible  when pollen counts or humidity is high and on windy days when pollen tends to remain in the air longer. Use air conditioning when possible.  Many air conditioners have filters that trap the pollen spores. Use a HEPA room air filter to remove pollen  form the indoor air you breathe.  DUST MITE AVOIDANCE MEASURES:  There are three main measures that need and can be taken to avoid house dust mites:  Reduce accumulation of dust in general -reduce furniture, clothing, carpeting, books, stuffed animals, especially in bedroom  Separate yourself from the dust -use pillow and mattress encasements (can be found at stores such as [employer], and [employer] or online) -avoid direct exposure to air condition flow -use a HEPA filter device, especially in the bedroom; you can also use a HEPA filter vacuum cleaner -wipe dust with a moist towel instead of a dry towel or broom when cleaning  Decrease mites and/or their secretions -wash clothing and linen and stuffed animals at highest temperature possible, at least every 2 weeks -stuffed animals can also be placed in a bag and put in a freezer overnight  Despite the above measures, it is impossible to eliminate dust mites or their allergen completely from your home.  With the above measures the burden of mites in your home can be diminished, with the goal of minimizing your allergic symptoms.  Success will be reached only when implementing and using all means together.   No orders of the defined types were placed in this encounter.  Lab Orders  No laboratory test(s) ordered today    Other allergy screening: Asthma: no Rhino conjunctivitis: yes: reports previous environmental testing due previous mold /cat exposure which was pan positive.  Took claritin regularly, but moved out house and symptoms resolved.  Will need AH in spring time if high pollen days.   Food allergy: no Medication allergy:  contact dermatitis to surgical tape and adhesives  Hymenoptera allergy: no Urticaria: no Eczema:no History of recurrent infections suggestive of immunodeficency: no  Diagnostics:   Skin Testing: Environmental allergy panel and select foods.  adequate controls  Results interpreted by myself and  discussed with patient/family.  Airborne Adult Perc - 04/18/23 1516     Time Antigen Placed 1516    Allergen Manufacturer Waynette Buttery    Location Back    Number of Test 55    Panel 1 Select    1. Control-Buffer 50% Glycerol Negative    2. Control-Histamine 4+    3. Bahia Negative    4. French Southern Territories Negative    5. Johnson Negative    6. Kentucky Blue Negative    7. Meadow Fescue Negative    8. Perennial Rye Negative    9. Timothy Negative    10. Ragweed Mix 4+    11. Cocklebur 4+    12. Plantain,  English 3+    13. Baccharis 4+    14. Dog Fennel 4+    16. Lamb's Quarters Negative    17. Sheep Sorrell 4+    18. Rough Pigweed 4+    19. Marsh Elder, Rough Negative    20. Mugwort, Common Negative    21. Box, Elder Negative    22. Cedar, red Negative    23. Sweet Gum Negative    24. Pecan Pollen Negative    25. Pine Mix Negative    26. Walnut, Black Pollen Negative    27. Red Mulberry Negative    28. Ash Mix Negative    29. Charletta Cousin  Mix Negative    30. Beech American Negative    31. Cottonwood, Guinea-Bissau Negative    32. Hickory, White Negative    33. Maple Mix Negative    34. Oak, Guinea-Bissau Mix Negative    35. Sycamore Eastern Negative    36. Alternaria Alternata Negative    37. Cladosporium Herbarum Negative    38. Aspergillus Mix Negative    39. Penicillium Mix Negative    40. Bipolaris Sorokiniana (Helminthosporium) Negative    41. Drechslera Spicifera (Curvularia) Negative    42. Mucor Plumbeus Negative    43. Fusarium Moniliforme Negative    44. Aureobasidium Pullulans (pullulara) Negative    45. Rhizopus Oryzae Negative    46. Botrytis Cinera Negative    47. Epicoccum Nigrum Negative    48. Phoma Betae Negative    49. Dust Mite Mix 4+    50. Cat Hair 10,000 BAU/ml Negative    51.  Dog Epithelia Negative    52. Mixed Feathers Negative    53. Horse Epithelia Negative    54. Cockroach, German Negative    55. Tobacco Leaf Negative             13 Food Perc - 04/18/23 1516        Test Information   Time Antigen Placed 1516    Allergen Manufacturer Waynette Buttery    Location Back    Number of allergen test 13    Food Select      Food   1. Peanut Negative    2. Soybean Negative    3. Wheat Negative    4. Sesame Negative    5. Milk, Cow Negative    6. Casein Negative    7. Egg White, Chicken Negative    8. Shellfish Mix Negative    9. Fish Mix Negative    10. Cashew Negative    11. Walnut Food Negative    12. Almond Negative    13. Hazelnut Negative             Past Medical History: Patient Active Problem List   Diagnosis Date Noted   Depression 03/07/2023   Pruritic condition 02/07/2023   Establishing care with new doctor, encounter for 02/07/2023   Tingling sensation 02/07/2023   Vitamin B 12 deficiency 02/07/2023   Seasonal allergies 01/13/2019   Pure hypertriglyceridemia 02/14/2016   Anxiety disorder 02/11/2016   Cervical radiculopathy 02/11/2016   Gastroesophageal reflux disease 02/11/2016   Hyperlipidemia 02/11/2016   Hypothyroidism 02/11/2016   Insomnia 02/11/2016   Pendulous breast 02/11/2016   Past Medical History:  Diagnosis Date   HLD (hyperlipidemia)    Hypothyroidism    Past Surgical History: Past Surgical History:  Procedure Laterality Date   CESAREAN SECTION     CHOLECYSTECTOMY     ENDOMETRIAL ABLATION     Medication List:  Current Outpatient Medications  Medication Sig Dispense Refill   Ascorbic Acid (VITAMIN C) 1000 MG tablet Take 1,000 mg by mouth daily.     Bacillus Coagulans-Inulin (PROBIOTIC) 1-250 BILLION-MG CAPS Take by mouth.     buPROPion (WELLBUTRIN XL) 150 MG 24 hr tablet Take 150 mg by mouth daily.     calcium carbonate (OS-CAL) 1250 (500 Ca) MG chewable tablet Chew 1 tablet by mouth.     Calcium-Magnesium-Vitamin D (CITRACAL CALCIUM+D) 600-40-500 MG-MG-UNIT TB24 Take by mouth.     cetirizine (ZYRTEC) 10 MG tablet Take 1 tablet (10 mg total) by mouth daily. 30 tablet 11   estradiol (ESTRACE) 2 MG  tablet Take 2 mg by mouth daily.     Ferrous Fumarate (IRON) 18 MG TBCR Take by mouth.     levothyroxine (SYNTHROID) 50 MCG tablet Take 50 mcg by mouth daily before breakfast.     MAGNESIUM PO Take by mouth. 1350mg      metroNIDAZOLE (METROCREAM) 0.75 % cream Apply topically 2 (two) times daily.     Multiple Vitamin (MULTIVITAMIN) tablet Take 1 tablet by mouth daily.     niacin 500 MG CR capsule Take 500 mg by mouth at bedtime.     progesterone (PROMETRIUM) 100 MG capsule Take 100 mg by mouth daily.     vitamin B-12 (CYANOCOBALAMIN) 500 MCG tablet Take 500 mcg by mouth daily.     No current facility-administered medications for this visit.   Allergies: Allergies  Allergen Reactions   Wound Dressing Adhesive Rash   Social History: Social History   Socioeconomic History   Marital status: Divorced    Spouse name: Not on file   Number of children: 2   Years of education: Not on file   Highest education level: Not on file  Occupational History   Not on file  Tobacco Use   Smoking status: Never    Passive exposure: Never   Smokeless tobacco: Never  Vaping Use   Vaping Use: Never used  Substance and Sexual Activity   Alcohol use: Yes    Comment: occas.   Drug use: Never   Sexual activity: Yes    Partners: Male  Other Topics Concern   Not on file  Social History Narrative   Not on file   Social Determinants of Health   Financial Resource Strain: Not on file  Food Insecurity: Not on file  Transportation Needs: Not on file  Physical Activity: Not on file  Stress: Not on file  Social Connections: Not on file   Lives in a single-family home that is 53 years old.  There are no roaches in the house and bed is 2 feet off the floor.  No dust mite precautions.  Not exposed to fumes, chemicals or dust.  There is a HEPA filter in the home and home [place] not near an interstate industrial area. Smoking: No exposure Occupation: Works as an Arts administrator and travel Advertising copywriter  History: Immunologist in the house: no Engineer, civil (consulting) in the family room: no Carpet in the bedroom: yes Heating: gas Cooling: central Pet: yes dog with access to bedroom  Family History: Family History  Problem Relation Age of Onset   Heart attack Father    Allergic rhinitis Son      ROS: All others negative except as noted per HPI.   Objective: BP 104/60   Pulse 67   Temp 97.7 F (36.5 C) (Temporal)   Resp 16   Ht 5' 3.5" (1.613 m)   Wt 156 lb 12.8 oz (71.1 kg)   SpO2 99%   BMI 27.34 kg/m  Body mass index is 27.34 kg/m.  General Appearance:  Alert, cooperative, no distress, appears stated age  Head:  Normocephalic, without obvious abnormality, atraumatic  Eyes:  Conjunctiva clear, EOM's intact  Nose: Nares normal, normal mucosa, no visible anterior polyps, and septum midline  Throat: Lips, tongue normal; teeth and gums normal, normal posterior oropharynx  Neck: Supple, symmetrical  Lungs:   clear to auscultation bilaterally, Respirations unlabored, no coughing  Heart:  regular rate and rhythm and no murmur, Appears well perfused  Extremities: No edema  Skin: Skin color, texture,  turgor normal, no rashes or lesions on visualized portions of skin  Neurologic: No gross deficits   The plan was reviewed with the patient/family, and all questions/concerned were addressed.  It was my pleasure to see Laureen today and participate in her care. Please feel free to contact me with any questions or concerns.  Sincerely,  Ferol Luz, MD Allergy & Immunology  [facility] office: [phone] [facility] office: [phone]

## 2023-04-18 NOTE — Patient Instructions (Addendum)
Neuropath/Pruritus  - Low suspicion this is due to to an allergic reaction -We will refer you to neurology our admin team will help reach out to you to establish this appointment  Chronic Rhinitis Seasonal and Perennial Allergic: - allergy testing today: Positive to weed pollen and dust mite  - Prevention:  - allergen avoidance when possible - consider allergy shots as long term control of your symptoms by teaching your immune system to be more tolerant of your allergy triggers  - Symptom control: - Consider Nasal Steroid Spray: Best results if used daily. - Options include Flonase (fluticasone), Nasocort (triamcinolone), Nasonex (mometasome) 1- 2 sprays in each nostril daily.  - All can be purchased over-the-counter if not covered by insurance. - Consider Antihistamine: daily or daily as needed.   -Options include Zyrtec (Cetirizine) 10mg , Claritin (Loratadine) 10mg , Allegra (Fexofenadine) 180mg , or Xyzal (Levocetirinze) 5mg  - Can be purchased over-the-counter if not covered by insurance.  Follow up: as needed   Thank you so much for letting me partake in your care today.  Don't hesitate to reach out if you have any additional concerns!  Ferol Luz, MD  [facility] [place] [place]  Reducing Pollen Exposure  The American Academy of Allergy, Asthma and Immunology suggests the following steps to reduce your exposure to pollen during allergy seasons.    Do not hang sheets or clothing out to dry; pollen may collect on these items. Do not mow lawns or spend time around freshly cut grass; mowing stirs up pollen. Keep windows closed at night.  Keep car windows closed while driving. Minimize morning activities outdoors, a time when pollen counts are usually at their highest. Stay indoors as much as possible when pollen counts or humidity is high and on windy days when pollen tends to remain in the air longer. Use air conditioning when possible.  Many air conditioners  have filters that trap the pollen spores. Use a HEPA room air filter to remove pollen form the indoor air you breathe.  DUST MITE AVOIDANCE MEASURES:  There are three main measures that need and can be taken to avoid house dust mites:  Reduce accumulation of dust in general -reduce furniture, clothing, carpeting, books, stuffed animals, especially in bedroom  Separate yourself from the dust -use pillow and mattress encasements (can be found at stores such as Bed, Bath, and Beyond or online) -avoid direct exposure to air condition flow -use a HEPA filter device, especially in the bedroom; you can also use a HEPA filter vacuum cleaner -wipe dust with a moist towel instead of a dry towel or broom when cleaning  Decrease mites and/or their secretions -wash clothing and linen and stuffed animals at highest temperature possible, at least every 2 weeks -stuffed animals can also be placed in a bag and put in a freezer overnight  Despite the above measures, it is impossible to eliminate dust mites or their allergen completely from your home.  With the above measures the burden of mites in your home can be diminished, with the goal of minimizing your allergic symptoms.  Success will be reached only when implementing and using all means together.

## 2023-04-19 NOTE — Addendum Note (Signed)
Addended by: Berna Bue on: 04/19/2023 11:55 AM   Modules accepted: Orders

## 2023-04-30 NOTE — Progress Notes (Signed)
Patient is scheduled to see Dr. Terrace Arabia on 07/09/2023. Patient is aware of this appt as they contacted the patient to get scheduled.

## 2023-05-25 ENCOUNTER — Encounter: Payer: Self-pay | Admitting: Family Medicine

## 2023-05-28 ENCOUNTER — Other Ambulatory Visit: Payer: Self-pay | Admitting: Family Medicine

## 2023-05-28 DIAGNOSIS — E678 Other specified hyperalimentation: Secondary | ICD-10-CM

## 2023-06-02 LAB — B12 AND FOLATE PANEL
Folate: >20 ng/mL (ref >3.0)
Vitamin B-12: 1217 pg/mL (ref 232–1245)

## 2023-06-25 ENCOUNTER — Ambulatory Visit: Payer: BC Managed Care – PPO | Admitting: Diagnostic Neuroimaging

## 2023-07-09 ENCOUNTER — Ambulatory Visit: Payer: BC Managed Care – PPO | Admitting: Neurology

## 2023-08-07 ENCOUNTER — Other Ambulatory Visit: Payer: Self-pay | Admitting: Family Medicine

## 2023-08-17 ENCOUNTER — Ambulatory Visit: Payer: BC Managed Care – PPO | Admitting: Family Medicine

## 2023-08-17 ENCOUNTER — Encounter: Payer: Self-pay | Admitting: Family Medicine

## 2023-08-17 VITALS — BP 112/72 | HR 75 | Ht 63.5 in | Wt 155.2 lb

## 2023-08-17 DIAGNOSIS — Z0000 Encounter for general adult medical examination without abnormal findings: Secondary | ICD-10-CM | POA: Diagnosis not present

## 2023-08-17 DIAGNOSIS — E559 Vitamin D deficiency, unspecified: Secondary | ICD-10-CM | POA: Diagnosis not present

## 2023-08-17 DIAGNOSIS — Z23 Encounter for immunization: Secondary | ICD-10-CM | POA: Diagnosis not present

## 2023-08-17 DIAGNOSIS — E039 Hypothyroidism, unspecified: Secondary | ICD-10-CM

## 2023-08-17 DIAGNOSIS — E538 Deficiency of other specified B group vitamins: Secondary | ICD-10-CM | POA: Diagnosis not present

## 2023-08-17 DIAGNOSIS — Z131 Encounter for screening for diabetes mellitus: Secondary | ICD-10-CM | POA: Diagnosis not present

## 2023-08-17 DIAGNOSIS — D509 Iron deficiency anemia, unspecified: Secondary | ICD-10-CM

## 2023-08-17 NOTE — Progress Notes (Signed)
New patient visit   Patient: Terri [name]   DOB: [birth year]   52 y.o. Female  MRN: [id number] Visit Date: 08/17/2023  Today's healthcare provider: Charlton Amor, DO   Chief Complaint  Patient presents with   New Patient (Initial Visit)    Establish Care    SUBJECTIVE    Chief Complaint  Patient presents with   New Patient (Initial Visit)    Establish Care   HPI HPI     New Patient (Initial Visit)    Additional comments: Establish Care      Last edited by Roselyn Reef, CMA on 08/17/2023  8:27 AM.      Pt presents for new patient visit.   Review of Systems  Constitutional:  Negative for activity change, fatigue and fever.  Respiratory:  Negative for cough and shortness of breath.   Cardiovascular:  Negative for chest pain.  Gastrointestinal:  Negative for abdominal pain.  Genitourinary:  Negative for difficulty urinating.       Current Meds  Medication Sig   Bacillus Coagulans-Inulin (PROBIOTIC) 1-250 BILLION-MG CAPS Take by mouth.   buPROPion (WELLBUTRIN XL) 150 MG 24 hr tablet Take 150 mg by mouth daily.   calcium carbonate (OS-CAL) 1250 (500 Ca) MG chewable tablet Chew 1 tablet by mouth.   Calcium-Magnesium-Vitamin D (CITRACAL CALCIUM+D) 600-40-500 MG-MG-UNIT TB24 Take by mouth.   cetirizine (ZYRTEC) 10 MG tablet Take 1 tablet (10 mg total) by mouth daily.   estradiol (ESTRACE) 2 MG tablet Take 2 mg by mouth daily.   Ferrous Fumarate (IRON) 18 MG TBCR Take by mouth.   levothyroxine (SYNTHROID) 50 MCG tablet Take 50 mcg by mouth daily before breakfast.   MAGNESIUM PO Take by mouth. 1350mg    metroNIDAZOLE (METROCREAM) 0.75 % cream Apply topically 2 (two) times daily.   Multiple Vitamin (MULTIVITAMIN) tablet Take 1 tablet by mouth daily.   niacin 500 MG CR capsule Take 500 mg by mouth at bedtime.   progesterone (PROMETRIUM) 100 MG capsule Take 100 mg by mouth daily.    OBJECTIVE    BP 112/72 (BP Location: Left Arm, Patient Position: Sitting,  Cuff Size: Normal)   Pulse 75   Ht 5' 3.5" (1.613 m)   Wt 155 lb 4 oz (70.4 kg)   SpO2 97%   BMI 27.07 kg/m   Physical Exam Vitals and nursing note reviewed.  Constitutional:      General: She is not in acute distress.    Appearance: Normal appearance.  HENT:     Head: Normocephalic and atraumatic.     Right Ear: Tympanic membrane, ear canal and external ear normal.     Left Ear: Tympanic membrane, ear canal and external ear normal.     Nose: Nose normal.  Eyes:     Conjunctiva/sclera: Conjunctivae normal.  Cardiovascular:     Rate and Rhythm: Normal rate and regular rhythm.  Pulmonary:     Effort: Pulmonary effort is normal.     Breath sounds: Normal breath sounds.  Abdominal:     General: Bowel sounds are normal. There is no distension.     Palpations: Abdomen is soft.     Tenderness: There is no abdominal tenderness.  Neurological:     General: No focal deficit present.     Mental Status: She is alert and oriented to person, place, and time.  Psychiatric:        Mood and Affect: Mood normal.        Behavior:  Behavior normal.        Thought Content: Thought content normal.        Judgment: Judgment normal.        ASSESSMENT & PLAN    Problem List Items Addressed This Visit       Endocrine   Hypothyroidism   Relevant Orders   TSH   Other Visit Diagnoses     Routine adult health maintenance    -  Primary   Relevant Orders   Lipid panel   CMP14+EGFR   CBC w/Diff/Platelet   B12 deficiency       Relevant Orders   Vitamin B12   Vitamin D deficiency       Relevant Orders   Vitamin D (25 hydroxy)   Screening for diabetes mellitus       Relevant Orders   HgB A1c   Iron deficiency anemia, unspecified iron deficiency anemia type       Relevant Orders   Fe+TIBC+Fer   Encounter for immunization       Relevant Orders   Flu vaccine trivalent PF, 6mos and older(Flulaval,Afluria,Fluarix,Fluzone) (Completed)   Varicella-zoster vaccine IM (Completed)       Pt will return next week to discuss numbness and tingling, migraines and depression  Return in about 1 week (around 08/24/2023) for acute concerns of migraines, numbness.      No orders of the defined types were placed in this encounter.   Orders Placed This Encounter  Procedures   Flu vaccine trivalent PF, 6mos and older(Flulaval,Afluria,Fluarix,Fluzone)   Varicella-zoster vaccine IM   Lipid panel    Order Specific Question:   Has the patient fasted?    Answer:   No    Order Specific Question:   Release to patient    Answer:   Immediate   CMP14+EGFR    Order Specific Question:   Has the patient fasted?    Answer:   No   CBC w/Diff/Platelet   Vitamin D (25 hydroxy)   Vitamin B12   HgB A1c   TSH   Fe+TIBC+Fer     Charlton Amor, DO  [facility] at [facility] [phone] (phone) [phone] (fax)  [facility]

## 2023-08-18 LAB — CBC WITH DIFFERENTIAL/PLATELET
Basophils Absolute: 0.1 10*3/uL (ref 0.0–0.2)
Basos: 1 %
EOS (ABSOLUTE): 0.1 10*3/uL (ref 0.0–0.4)
Eos: 2 %
Hematocrit: 38.1 % (ref 34.0–46.6)
Hemoglobin: 12.6 g/dL (ref 11.1–15.9)
Immature Grans (Abs): 0 10*3/uL (ref 0.0–0.1)
Immature Granulocytes: 0 %
Lymphocytes Absolute: 2.6 10*3/uL (ref 0.7–3.1)
Lymphs: 41 %
MCH: 30.4 pg (ref 26.6–33.0)
MCHC: 33.1 g/dL (ref 31.5–35.7)
MCV: 92 fL (ref 79–97)
Monocytes Absolute: 0.4 10*3/uL (ref 0.1–0.9)
Monocytes: 7 %
Neutrophils Absolute: 3.2 10*3/uL (ref 1.4–7.0)
Neutrophils: 49 %
Platelets: 305 10*3/uL (ref 150–450)
RBC: 4.14 x10E6/uL (ref 3.77–5.28)
RDW: 12.7 % (ref 11.7–15.4)
WBC: 6.4 10*3/uL (ref 3.4–10.8)

## 2023-08-18 LAB — CMP14+EGFR
ALT: 20 [IU]/L (ref 0–32)
AST: 25 [IU]/L (ref 0–40)
Albumin: 3.8 g/dL (ref 3.8–4.9)
Alkaline Phosphatase: 71 [IU]/L (ref 44–121)
BUN/Creatinine Ratio: 12 (ref 9–23)
BUN: 9 mg/dL (ref 6–24)
Bilirubin Total: <0.2 mg/dL (ref 0.0–1.2)
CO2: 27 mmol/L (ref 20–29)
Calcium: 9.9 mg/dL (ref 8.7–10.2)
Chloride: 101 mmol/L (ref 96–106)
Creatinine, Ser: 0.75 mg/dL (ref 0.57–1.00)
Globulin, Total: 2.6 g/dL (ref 1.5–4.5)
Glucose: 83 mg/dL (ref 70–99)
Potassium: 4.5 mmol/L (ref 3.5–5.2)
Sodium: 140 mmol/L (ref 134–144)
Total Protein: 6.4 g/dL (ref 6.0–8.5)
eGFR: 95 mL/min/{1.73_m2} (ref >59)

## 2023-08-18 LAB — HEMOGLOBIN A1C
Est. average glucose Bld gHb Est-mCnc: 111 mg/dL
Hgb A1c MFr Bld: 5.5 % (ref 4.8–5.6)

## 2023-08-18 LAB — VITAMIN D 25 HYDROXY (VIT D DEFICIENCY, FRACTURES): Vit D, 25-Hydroxy: 54.2 ng/mL (ref 30.0–100.0)

## 2023-08-18 LAB — IRON,TIBC AND FERRITIN PANEL
Ferritin: 18 ng/mL (ref 15–150)
Iron Saturation: 13 % — ABNORMAL LOW (ref 15–55)
Iron: 58 ug/dL (ref 27–159)
Total Iron Binding Capacity: 456 ug/dL — ABNORMAL HIGH (ref 250–450)
UIBC: 398 ug/dL (ref 131–425)

## 2023-08-18 LAB — LIPID PANEL
Chol/HDL Ratio: 3.7 ratio (ref 0.0–4.4)
Cholesterol, Total: 214 mg/dL — ABNORMAL HIGH (ref 100–199)
HDL: 58 mg/dL (ref >39)
LDL Chol Calc (NIH): 118 mg/dL — ABNORMAL HIGH (ref 0–99)
Triglycerides: 217 mg/dL — ABNORMAL HIGH (ref 0–149)
VLDL Cholesterol Cal: 38 mg/dL (ref 5–40)

## 2023-08-18 LAB — VITAMIN B12: Vitamin B-12: 942 pg/mL (ref 232–1245)

## 2023-08-18 LAB — TSH: TSH: 2.19 u[IU]/mL (ref 0.450–4.500)

## 2023-08-24 ENCOUNTER — Ambulatory Visit: Payer: BC Managed Care – PPO | Admitting: Family Medicine

## 2023-08-27 ENCOUNTER — Ambulatory Visit: Payer: BC Managed Care – PPO | Admitting: Family Medicine

## 2023-08-27 ENCOUNTER — Encounter: Payer: Self-pay | Admitting: Family Medicine

## 2023-08-27 VITALS — BP 115/77 | HR 71 | Ht 63.5 in | Wt 157.2 lb

## 2023-08-27 DIAGNOSIS — G43809 Other migraine, not intractable, without status migrainosus: Secondary | ICD-10-CM

## 2023-08-27 DIAGNOSIS — G43909 Migraine, unspecified, not intractable, without status migrainosus: Secondary | ICD-10-CM | POA: Insufficient documentation

## 2023-08-27 DIAGNOSIS — F410 Panic disorder [episodic paroxysmal anxiety] without agoraphobia: Secondary | ICD-10-CM | POA: Insufficient documentation

## 2023-08-27 DIAGNOSIS — F321 Major depressive disorder, single episode, moderate: Secondary | ICD-10-CM | POA: Diagnosis not present

## 2023-08-27 DIAGNOSIS — E7800 Pure hypercholesterolemia, unspecified: Secondary | ICD-10-CM | POA: Insufficient documentation

## 2023-08-27 MED ORDER — BUPROPION HCL ER (XL) 300 MG PO TB24: 300.0000 mg | ORAL_TABLET | Freq: Every day | ORAL | 1 refills | Status: DC

## 2023-08-27 MED ORDER — SUMATRIPTAN SUCCINATE 50 MG PO TABS: 50.0000 mg | ORAL_TABLET | ORAL | 0 refills | Status: DC | PRN

## 2023-08-27 MED ORDER — CITALOPRAM HYDROBROMIDE 10 MG PO TABS: 10.0000 mg | ORAL_TABLET | Freq: Every day | ORAL | 3 refills | Status: DC

## 2023-08-27 MED ORDER — ALPRAZOLAM 0.5 MG PO TABS: 0.5000 mg | ORAL_TABLET | Freq: Every day | ORAL | 0 refills | Status: AC | PRN

## 2023-08-27 NOTE — Progress Notes (Signed)
Established patient visit   Patient: Terri [name]   DOB: September 01, 1971   52 y.o. Female  MRN: [id number] Visit Date: 08/27/2023  Today's healthcare provider: Charlton Amor, DO   Chief Complaint  Patient presents with   Medical Management of Chronic Issues    migraines    SUBJECTIVE    Chief Complaint  Patient presents with   Medical Management of Chronic Issues    migraines   HPI HPI     Medical Management of Chronic Issues    Additional comments: migraines      Last edited by Roselyn Reef, CMA on 08/27/2023  3:16 PM.      Pt presents for one week follow up. She was seen last week as a new patient and is here today to discuss concerns of migraines as well as neuropathy.   Blood work needing to be discussed today.   Migraines - typically with weather changes  Pt has peripheral neuropathy from head down or fingers. She would wake up in the middle of the night with burning and tingling and itching. She was diagnosed with b12 excess and stopped b12 vitamins and symptoms resolved for the most part. In the past two weeks she notices she starts to tingle and her chest turns red and she notices it in her face.   Review of Systems  Constitutional:  Negative for activity change, fatigue and fever.  Eyes:  Negative for pain.  Respiratory:  Negative for cough and shortness of breath.   Cardiovascular:  Negative for chest pain.  Gastrointestinal:  Negative for abdominal pain.  Genitourinary:  Negative for difficulty urinating.  Neurological:  Positive for numbness.       Current Meds  Medication Sig   ALPRAZolam (XANAX) 0.5 MG tablet Take 1 tablet (0.5 mg total) by mouth daily as needed for anxiety.   Bacillus Coagulans-Inulin (PROBIOTIC) 1-250 BILLION-MG CAPS Take by mouth.   buPROPion (WELLBUTRIN XL) 300 MG 24 hr tablet Take 1 tablet (300 mg total) by mouth daily.   calcium carbonate (OS-CAL) 1250 (500 Ca) MG chewable tablet Chew 1 tablet by mouth.    Calcium-Magnesium-Vitamin D (CITRACAL CALCIUM+D) 600-40-500 MG-MG-UNIT TB24 Take by mouth.   cetirizine (ZYRTEC) 10 MG tablet Take 1 tablet (10 mg total) by mouth daily.   citalopram (CELEXA) 10 MG tablet Take 1 tablet (10 mg total) by mouth daily.   estradiol (ESTRACE) 2 MG tablet Take 2 mg by mouth daily.   Ferrous Fumarate (IRON) 18 MG TBCR Take by mouth.   levothyroxine (SYNTHROID) 50 MCG tablet Take 50 mcg by mouth daily before breakfast.   MAGNESIUM PO Take by mouth. 1350mg    metroNIDAZOLE (METROCREAM) 0.75 % cream Apply topically 2 (two) times daily.   Multiple Vitamin (MULTIVITAMIN) tablet Take 1 tablet by mouth daily.   niacin 500 MG CR capsule Take 500 mg by mouth at bedtime.   progesterone (PROMETRIUM) 100 MG capsule Take 100 mg by mouth daily.   SUMAtriptan (IMITREX) 50 MG tablet Take 1 tablet (50 mg total) by mouth every 2 (two) hours as needed for migraine. May repeat in 2 hours if headache persists or recurs. Max of two doses per day   [DISCONTINUED] buPROPion (WELLBUTRIN XL) 150 MG 24 hr tablet Take 150 mg by mouth daily.    OBJECTIVE    BP 115/77 (BP Location: Left Arm, Patient Position: Sitting, Cuff Size: Normal)   Pulse 71   Ht 5' 3.5" (1.613 m)   Wt 157  lb 4 oz (71.3 kg)   SpO2 99%   BMI 27.42 kg/m   Physical Exam Vitals and nursing note reviewed.  Constitutional:      General: She is not in acute distress.    Appearance: Normal appearance.  HENT:     Head: Normocephalic and atraumatic.     Right Ear: External ear normal.     Left Ear: External ear normal.     Nose: Nose normal.  Eyes:     Conjunctiva/sclera: Conjunctivae normal.  Cardiovascular:     Rate and Rhythm: Normal rate and regular rhythm.  Pulmonary:     Effort: Pulmonary effort is normal.     Breath sounds: Normal breath sounds.  Neurological:     General: No focal deficit present.     Mental Status: She is alert and oriented to person, place, and time.  Psychiatric:        Mood and  Affect: Mood normal.        Behavior: Behavior normal.        Thought Content: Thought content normal.        Judgment: Judgment normal.        ASSESSMENT & PLAN    Problem List Items Addressed This Visit       Cardiovascular and Mediastinum   Migraine - Primary    - will go ahead and do imitrex and have patient record how frequently she is needing them       Relevant Medications   SUMAtriptan (IMITREX) 50 MG tablet   citalopram (CELEXA) 10 MG tablet   buPROPion (WELLBUTRIN XL) 300 MG 24 hr tablet     Other   Depression    Have increased wellbutrin to 300mg  to see if we can help decrease patient lows she has around the holidays - will also do 30 pills of xanax to see if this helps with her symptoms. Pmp reviewed and verified with no red flags. Pt says she has been on xanax in the past and is aware of use.       Relevant Medications   citalopram (CELEXA) 10 MG tablet   ALPRAZolam (XANAX) 0.5 MG tablet   buPROPion (WELLBUTRIN XL) 300 MG 24 hr tablet   Panic attacks    It sounds like patient is having panic attacks with her numbness and tingling and I am wondering if this is secondary to CO2 buildup. We discuss that stress can present differently in our life  - have decided to add on celexa 10mg  to see if this helps anxiety type symptoms      Relevant Medications   citalopram (CELEXA) 10 MG tablet   ALPRAZolam (XANAX) 0.5 MG tablet   buPROPion (WELLBUTRIN XL) 300 MG 24 hr tablet   High cholesterol    The 10-year ASCVD risk score (Arnett DK, et al., 2019) is: 1.4%   Values used to calculate the score:     Age: 74 years     Sex: Female     Is Non-Hispanic African American: No     Diabetic: No     Tobacco smoker: No     Systolic Blood Pressure: 115 mmHg     Is BP treated: No     HDL Cholesterol: 58 mg/dL     Total Cholesterol: 214 mg/dL - will just focus on diet and exercise       Return in about 6 weeks (around 10/08/2023).      Meds ordered this encounter   Medications  SUMAtriptan (IMITREX) 50 MG tablet    Sig: Take 1 tablet (50 mg total) by mouth every 2 (two) hours as needed for migraine. May repeat in 2 hours if headache persists or recurs. Max of two doses per day    Dispense:  30 tablet    Refill:  0   citalopram (CELEXA) 10 MG tablet    Sig: Take 1 tablet (10 mg total) by mouth daily.    Dispense:  30 tablet    Refill:  3   ALPRAZolam (XANAX) 0.5 MG tablet    Sig: Take 1 tablet (0.5 mg total) by mouth daily as needed for anxiety.    Dispense:  20 tablet    Refill:  0   buPROPion (WELLBUTRIN XL) 300 MG 24 hr tablet    Sig: Take 1 tablet (300 mg total) by mouth daily.    Dispense:  90 tablet    Refill:  1    No orders of the defined types were placed in this encounter.    Charlton Amor, DO  [facility] Medicine at [facility] [phone] (phone) [phone] (fax)  [facility]

## 2023-08-27 NOTE — Assessment & Plan Note (Signed)
The 10-year ASCVD risk score (Arnett DK, et al., 2019) is: 1.4%   Values used to calculate the score:     Age: 52 years     Sex: Female     Is Non-Hispanic African American: No     Diabetic: No     Tobacco smoker: No     Systolic Blood Pressure: 115 mmHg     Is BP treated: No     HDL Cholesterol: 58 mg/dL     Total Cholesterol: 214 mg/dL - will just focus on diet and exercise

## 2023-08-27 NOTE — Assessment & Plan Note (Signed)
-   will go ahead and do imitrex and have patient record how frequently she is needing them

## 2023-08-27 NOTE — Assessment & Plan Note (Signed)
Have increased wellbutrin to 300mg  to see if we can help decrease patient lows she has around the holidays - will also do 30 pills of xanax to see if this helps with her symptoms. Pmp reviewed and verified with no red flags. Pt says she has been on xanax in the past and is aware of use.

## 2023-08-27 NOTE — Assessment & Plan Note (Signed)
It sounds like patient is having panic attacks with her numbness and tingling and I am wondering if this is secondary to CO2 buildup. We discuss that stress can present differently in our life  - have decided to add on celexa 10mg  to see if this helps anxiety type symptoms

## 2023-10-01 ENCOUNTER — Encounter: Payer: Self-pay | Admitting: Family Medicine

## 2023-10-01 ENCOUNTER — Ambulatory Visit: Payer: BC Managed Care – PPO | Admitting: Family Medicine

## 2023-10-01 VITALS — BP 124/68 | HR 69 | Temp 97.5°F | Ht 63.5 in | Wt 155.0 lb

## 2023-10-01 DIAGNOSIS — J0110 Acute frontal sinusitis, unspecified: Secondary | ICD-10-CM

## 2023-10-01 MED ORDER — AMOXICILLIN 875 MG PO TABS: 875.0000 mg | ORAL_TABLET | Freq: Two times a day (BID) | ORAL | 0 refills | Status: DC

## 2023-10-01 NOTE — Progress Notes (Signed)
   Acute Office Visit  Subjective:     Patient ID: Terri [name], female    DOB: [birth year], 52 y.o.   MRN: [id number]  Chief Complaint  Patient presents with   Sinusitis   Ear Pain    R ear x 2 days     HPI Patient is in today for  Pt reports that her sxs started last week with sore throat and mild nasal congestion.. She has been taking IBU. She started taking Sudafed yesterday.    She also c/o R ear pain and headache that started on her R side above her ear over the weekend.  She has a lot of pressure on the top of her head.. The pain did get better after she took IBU. She now states that the headache has moved across her head. + diarrhea.    Denies any f/s/c/n/v    ROS      Objective:    BP 124/68   Pulse 69   Temp (!) 97.5 F (36.4 C) (Temporal)   Ht 5' 3.5" (1.613 m)   Wt 155 lb (70.3 kg)   SpO2 100%   BMI 27.03 kg/m    Physical Exam Constitutional:      Appearance: Normal appearance.  HENT:     Head: Normocephalic and atraumatic.     Right Ear: Tympanic membrane, ear canal and external ear normal. There is no impacted cerumen.     Left Ear: Tympanic membrane, ear canal and external ear normal. There is no impacted cerumen.     Nose: Nose normal.     Mouth/Throat:     Pharynx: Oropharynx is clear.  Eyes:     Conjunctiva/sclera: Conjunctivae normal.  Cardiovascular:     Rate and Rhythm: Normal rate and regular rhythm.  Pulmonary:     Effort: Pulmonary effort is normal.     Breath sounds: Normal breath sounds.  Musculoskeletal:     Cervical back: Neck supple. No tenderness.  Lymphadenopathy:     Cervical: No cervical adenopathy.  Skin:    General: Skin is warm and dry.  Neurological:     Mental Status: She is alert and oriented to person, place, and time.  Psychiatric:        Mood and Affect: Mood normal.     No results found for any visits on 10/01/23.      Assessment & Plan:   Problem List Items Addressed This Visit   None Visit  Diagnoses       Acute non-recurrent frontal sinusitis    -  Primary   Relevant Medications   amoxicillin (AMOXIL) 875 MG tablet      Acute sinusitis-symptoms have been getting worse over the weekend.  She is having right ear pain no sign of acute otitis media.  Will go ahead and treat with amoxicillin call if not better in 1 week continue symptomatic care including her Nettie pot.  Meds ordered this encounter  Medications   amoxicillin (AMOXIL) 875 MG tablet    Sig: Take 1 tablet (875 mg total) by mouth 2 (two) times daily.    Dispense:  14 tablet    Refill:  0    Return if symptoms worsen or fail to improve.  Nani Gasser, MD

## 2023-10-01 NOTE — Progress Notes (Signed)
Pt reports that her sxs started last week. She has been taking IBU. She started taking Sudafed yesterday.   She also c/o R ear pain and headache that started on her R side above her ear. The pain did get better after she took IBU. She now states that the headache has moved across her head.  Denies any f/s/c/n/v

## 2023-10-05 ENCOUNTER — Telehealth: Payer: Self-pay

## 2023-10-05 ENCOUNTER — Ambulatory Visit: Payer: BC Managed Care – PPO | Admitting: Family Medicine

## 2023-10-05 ENCOUNTER — Encounter: Payer: Self-pay | Admitting: Family Medicine

## 2023-10-05 VITALS — BP 114/70 | HR 72 | Ht 63.5 in | Wt 157.0 lb

## 2023-10-05 DIAGNOSIS — G43809 Other migraine, not intractable, without status migrainosus: Secondary | ICD-10-CM

## 2023-10-05 DIAGNOSIS — E785 Hyperlipidemia, unspecified: Secondary | ICD-10-CM

## 2023-10-05 DIAGNOSIS — G43009 Migraine without aura, not intractable, without status migrainosus: Secondary | ICD-10-CM

## 2023-10-05 DIAGNOSIS — F411 Generalized anxiety disorder: Secondary | ICD-10-CM

## 2023-10-05 MED ORDER — SUMATRIPTAN SUCCINATE 50 MG PO TABS: 50.0000 mg | ORAL_TABLET | ORAL | 0 refills | Status: DC | PRN

## 2023-10-05 MED ORDER — CITALOPRAM HYDROBROMIDE 10 MG PO TABS: 10.0000 mg | ORAL_TABLET | Freq: Every day | ORAL | 3 refills | Status: DC

## 2023-10-05 MED ORDER — NIACIN ER (ANTIHYPERLIPIDEMIC) 500 MG PO TBCR: 500.0000 mg | EXTENDED_RELEASE_TABLET | Freq: Every day | ORAL | 2 refills | Status: DC

## 2023-10-05 MED ORDER — NIACIN ER 500 MG PO CPCR
500.0000 mg | ORAL_CAPSULE | Freq: Every day | ORAL | 2 refills | Status: DC
Start: 2023-10-05 — End: 2023-10-05

## 2023-10-05 MED ORDER — SUMATRIPTAN SUCCINATE 50 MG PO TABS: 50.0000 mg | ORAL_TABLET | ORAL | 1 refills | Status: DC | PRN

## 2023-10-05 NOTE — Telephone Encounter (Signed)
Copied from CRM [id number]. Topic: Clinical - Prescription Issue >> Oct 05, 2023 10:08 AM Elle L wrote: Reason for CRM: Maralyn Sago from the [facility] is requesting to see if the niacin 500 MG CR capsule prescription can be an extended release tablet as the pharmacy can not fill the current medication.   [id number] - [place], [place] - [address], [place] [address] Phone: [phone]  Fax: [phone]

## 2023-10-05 NOTE — Progress Notes (Signed)
Established patient visit   Patient: Terri [name]   DOB: 05/01/1971   52 y.o. Female  MRN: [id number] Visit Date: 10/05/2023  Today's healthcare provider: Charlton Amor, DO   Chief Complaint  Patient presents with   Medical Management of Chronic Issues    MDD, panic attacks, migraines    SUBJECTIVE    Chief Complaint  Patient presents with   Medical Management of Chronic Issues    MDD, panic attacks, migraines   HPI HPI     Medical Management of Chronic Issues    Additional comments: MDD, panic attacks, migraines      Last edited by Roselyn Reef, CMA on 10/05/2023  8:29 AM.      MDD - doing well on wellbutrin and celexa  Panic attacks have gotten better.  - on wellbutrin 300mg   - celexa 10mg    She is getting more migraines with her sinus infection and has used more imitrex    Review of Systems  Constitutional:  Negative for activity change, fatigue and fever.  Respiratory:  Negative for cough and shortness of breath.   Cardiovascular:  Negative for chest pain.  Gastrointestinal:  Negative for abdominal pain.  Genitourinary:  Negative for difficulty urinating.       Current Meds  Medication Sig   ALPRAZolam (XANAX) 0.5 MG tablet Take 1 tablet (0.5 mg total) by mouth daily as needed for anxiety.   amoxicillin (AMOXIL) 875 MG tablet Take 1 tablet (875 mg total) by mouth 2 (two) times daily.   Bacillus Coagulans-Inulin (PROBIOTIC) 1-250 BILLION-MG CAPS Take by mouth.   buPROPion (WELLBUTRIN XL) 300 MG 24 hr tablet Take 1 tablet (300 mg total) by mouth daily.   calcium carbonate (OS-CAL) 1250 (500 Ca) MG chewable tablet Chew 1 tablet by mouth.   Calcium-Magnesium-Vitamin D (CITRACAL CALCIUM+D) 600-40-500 MG-MG-UNIT TB24 Take by mouth.   estradiol (ESTRACE) 2 MG tablet Take 2 mg by mouth daily.   Ferrous Fumarate (IRON) 18 MG TBCR Take by mouth.   levothyroxine (SYNTHROID) 50 MCG tablet Take 50 mcg by mouth daily before breakfast.   MAGNESIUM PO  Take by mouth. 1350mg    metroNIDAZOLE (METROCREAM) 0.75 % cream Apply topically 2 (two) times daily.   Multiple Vitamin (MULTIVITAMIN) tablet Take 1 tablet by mouth daily.   progesterone (PROMETRIUM) 100 MG capsule Take 100 mg by mouth daily.   [DISCONTINUED] citalopram (CELEXA) 10 MG tablet Take 1 tablet (10 mg total) by mouth daily.   [DISCONTINUED] niacin 500 MG CR capsule Take 500 mg by mouth at bedtime.   [DISCONTINUED] SUMAtriptan (IMITREX) 50 MG tablet Take 1 tablet (50 mg total) by mouth every 2 (two) hours as needed for migraine. May repeat in 2 hours if headache persists or recurs. Max of two doses per day    OBJECTIVE    BP 114/70 (BP Location: Left Arm, Patient Position: Sitting, Cuff Size: Large)   Pulse 72   Ht 5' 3.5" (1.613 m)   Wt 157 lb (71.2 kg)   SpO2 100%   BMI 27.38 kg/m   Physical Exam Vitals and nursing note reviewed.  Constitutional:      General: She is not in acute distress.    Appearance: Normal appearance.  HENT:     Head: Normocephalic and atraumatic.     Right Ear: External ear normal.     Left Ear: External ear normal.     Nose: Nose normal.  Eyes:     Conjunctiva/sclera: Conjunctivae normal.  Cardiovascular:     Rate and Rhythm: Normal rate.  Pulmonary:     Effort: Pulmonary effort is normal.  Neurological:     General: No focal deficit present.     Mental Status: She is alert and oriented to person, place, and time.  Psychiatric:        Mood and Affect: Mood normal.        Behavior: Behavior normal.        Thought Content: Thought content normal.        Judgment: Judgment normal.        ASSESSMENT & PLAN    Problem List Items Addressed This Visit       Cardiovascular and Mediastinum   Migraine   Pt has been doing well taking imitrex, have gone ahead and sent in refills.      Relevant Medications   citalopram (CELEXA) 10 MG tablet   SUMAtriptan (IMITREX) 50 MG tablet     Other   GAD (generalized anxiety disorder) - Primary    Pt doing very well on celexa, we will continue at the 10mg  dose - plan to see me in May (or March if she wants to get off medication sooner)      Relevant Medications   citalopram (CELEXA) 10 MG tablet   Hyperlipidemia   Had a discussion with patient about niacin. She has been doing well with taking this and HDL looks good. We discussed coming off this but patient will keep taking this for now since it is working well for her. She does have a hx of high cholesterol      Relevant Medications   niacin 500 MG CR capsule    Return in about 3 months (around 01/03/2024).      Meds ordered this encounter  Medications   citalopram (CELEXA) 10 MG tablet    Sig: Take 1 tablet (10 mg total) by mouth daily.    Dispense:  30 tablet    Refill:  3   DISCONTD: SUMAtriptan (IMITREX) 50 MG tablet    Sig: Take 1 tablet (50 mg total) by mouth every 2 (two) hours as needed for migraine. May repeat in 2 hours if headache persists or recurs. Max of two doses per day    Dispense:  30 tablet    Refill:  0   niacin 500 MG CR capsule    Sig: Take 1 capsule (500 mg total) by mouth at bedtime.    Dispense:  90 capsule    Refill:  2   SUMAtriptan (IMITREX) 50 MG tablet    Sig: Take 1 tablet (50 mg total) by mouth every 2 (two) hours as needed for migraine. May repeat in 2 hours if headache persists or recurs. Max of two doses per day    Dispense:  30 tablet    Refill:  1    No orders of the defined types were placed in this encounter.    Charlton Amor, DO  [facility] Primary Care & Sports Medicine at [facility] [phone] (phone) [phone] (fax)  [facility]

## 2023-10-05 NOTE — Telephone Encounter (Signed)
Niacin ER 500mg  at bedtime daily sent to the pharmacy on file.   ___________________________________________ Thayer Ohm, DNP, APRN, FNP-BC Primary Care and Sports Medicine [facility]

## 2023-10-05 NOTE — Assessment & Plan Note (Signed)
Had a discussion with patient about niacin. She has been doing well with taking this and HDL looks good. We discussed coming off this but patient will keep taking this for now since it is working well for her. She does have a hx of high cholesterol

## 2023-10-05 NOTE — Assessment & Plan Note (Signed)
Pt doing very well on celexa, we will continue at the 10mg  dose - plan to see me in May (or March if she wants to get off medication sooner)

## 2023-10-05 NOTE — Assessment & Plan Note (Signed)
Pt has been doing well taking imitrex, have gone ahead and sent in refills.

## 2023-10-05 NOTE — Addendum Note (Signed)
Addended byChristen Butter on: 10/05/2023 03:46 PM   Modules accepted: Orders

## 2023-10-31 ENCOUNTER — Other Ambulatory Visit: Payer: Self-pay | Admitting: Medical Genetics

## 2023-11-16 ENCOUNTER — Other Ambulatory Visit (HOSPITAL_COMMUNITY)
Admission: RE | Admit: 2023-11-16 | Discharge: 2023-11-16 | Disposition: A | Discharge status: Home / Self Care | Payer: Self-pay | Source: Ambulatory Visit | Attending: Medical Genetics | Admitting: Medical Genetics

## 2023-11-30 LAB — GENECONNECT MOLECULAR SCREEN: Genetic Analysis Overall Interpretation: NEGATIVE

## 2024-02-05 ENCOUNTER — Ambulatory Visit

## 2024-02-05 ENCOUNTER — Encounter: Payer: Self-pay | Admitting: Emergency Medicine

## 2024-02-05 ENCOUNTER — Ambulatory Visit
Admission: EM | Admit: 2024-02-05 | Discharge: 2024-02-05 | Disposition: A | Discharge status: Home / Self Care | Attending: Family Medicine | Admitting: Family Medicine

## 2024-02-05 DIAGNOSIS — S8991XA Unspecified injury of right lower leg, initial encounter: Secondary | ICD-10-CM | POA: Diagnosis not present

## 2024-02-05 DIAGNOSIS — S8001XA Contusion of right knee, initial encounter: Secondary | ICD-10-CM

## 2024-02-05 DIAGNOSIS — M25561 Pain in right knee: Secondary | ICD-10-CM | POA: Diagnosis not present

## 2024-02-05 MED ORDER — CELECOXIB 200 MG PO CAPS: 200.0000 mg | ORAL_CAPSULE | Freq: Every day | ORAL | 0 refills | Status: AC

## 2024-02-05 NOTE — ED Provider Notes (Signed)
 [facility]    CSN: [id number] Arrival date & time: 02/05/24  1002      History   Chief Complaint Chief Complaint  Patient presents with   Knee Pain    HPI Terri [name] is a 53 y.o. female.   HPI pleasant 53 year old female presents with right knee pain secondary to falling down stairs earlier this morning at her home.  PMH significant for migraine, panic attacks, and hypothyroidism.  Past Medical History:  Diagnosis Date   HLD (hyperlipidemia)    Hypothyroidism     Patient Active Problem List   Diagnosis Date Noted   Panic attacks 08/27/2023   Migraine 08/27/2023   High cholesterol 08/27/2023   Depression 03/07/2023   Pruritic condition 02/07/2023   Establishing care with new doctor, encounter for 02/07/2023   Tingling sensation 02/07/2023   Vitamin B 12 deficiency 02/07/2023   Seasonal allergies 01/13/2019   Pure hypertriglyceridemia 02/14/2016   GAD (generalized anxiety disorder) 02/11/2016   Cervical radiculopathy 02/11/2016   Gastroesophageal reflux disease 02/11/2016   Hyperlipidemia 02/11/2016   Hypothyroidism 02/11/2016   Insomnia 02/11/2016   Pendulous breast 02/11/2016    Past Surgical History:  Procedure Laterality Date   CESAREAN SECTION     CHOLECYSTECTOMY     ENDOMETRIAL ABLATION      OB History   No obstetric history on file.      Home Medications    Prior to Admission medications   Medication Sig Start Date End Date Taking? Authorizing Provider  ALPRAZolam  (XANAX ) 0.5 MG tablet Take 1 tablet (0.5 mg total) by mouth daily as needed for anxiety. 08/27/23  Yes Dianah Fort, Erika S, DO  Bacillus Coagulans-Inulin (PROBIOTIC) 1-250 BILLION-MG CAPS Take by mouth.   Yes [provider]  buPROPion  (WELLBUTRIN  XL) 300 MG 24 hr tablet Take 1 tablet (300 mg total) by mouth daily. 08/27/23  Yes Alyne Jules S, DO  calcium carbonate (OS-CAL) 1250 (500 Ca) MG chewable tablet Chew 1 tablet by mouth. 12/13/17  Yes [provider]  Calcium-Magnesium-Vitamin D  (CITRACAL CALCIUM+D) 600-40-500 MG-MG-UNIT TB24 Take by mouth.   Yes [provider]  celecoxib (CELEBREX) 200 MG capsule Take 1 capsule (200 mg total) by mouth daily for 15 days. 02/05/24 02/20/24 Yes Leonides Ramp, FNP  citalopram  (CELEXA ) 10 MG tablet Take 1 tablet (10 mg total) by mouth daily. 10/05/23  Yes Dianah Fort, Erika S, DO  estradiol (ESTRACE) 2 MG tablet Take 2 mg by mouth daily.   Yes [provider]  Ferrous Fumarate (IRON) 18 MG TBCR Take by mouth.   Yes [provider]  levothyroxine (SYNTHROID) 50 MCG tablet Take 50 mcg by mouth daily before breakfast. 11/08/15  Yes [provider]  MAGNESIUM PO Take by mouth. 1350mg    Yes [provider]  Multiple Vitamin (MULTIVITAMIN) tablet Take 1 tablet by mouth daily.   Yes [provider]  progesterone (PROMETRIUM) 100 MG capsule Take 100 mg by mouth daily.   Yes [provider]  SUMAtriptan  (IMITREX ) 50 MG tablet Take 1 tablet (50 mg total) by mouth every 2 (two) hours as needed for migraine. May repeat in 2 hours if headache persists or recurs. Max of two doses per day 10/05/23  Yes Wachs, Erika S, DO  amoxicillin  (AMOXIL ) 875 MG tablet Take 1 tablet (875 mg total) by mouth 2 (two) times daily. 10/01/23   Cydney Draft, MD  metroNIDAZOLE (METROCREAM) 0.75 % cream Apply topically 2 (two) times daily.    [provider]  niacin  (VITAMIN B3) 500 MG ER tablet Take 1 tablet (500 mg total) by mouth at bedtime. 10/05/23   Cherre Cornish, NP    Family History Family History  Problem Relation Age of Onset   Heart attack Father    Allergic rhinitis Son     Social History Social History   Tobacco Use   Smoking status: Never    Passive exposure: Never   Smokeless tobacco: Never  Vaping Use   Vaping status: Never Used  Substance Use Topics   Alcohol use: Yes    Comment: occas.   Drug use: Never     Allergies   Wound  dressing adhesive and Latex   Review of Systems Review of Systems   Physical Exam Triage Vital Signs ED Triage Vitals  Encounter Vitals Group     BP 02/05/24 1024 118/73     Systolic BP Percentile --      Diastolic BP Percentile --      Pulse Rate 02/05/24 1024 74     Resp 02/05/24 1024 18     Temp 02/05/24 1024 98.4 F (36.9 C)     Temp Source 02/05/24 1024 Oral     SpO2 02/05/24 1024 100 %     Weight 02/05/24 1026 160 lb (72.6 kg)     Height 02/05/24 1026 5\' 4"  (1.626 m)     Head Circumference --      Peak Flow --      Pain Score 02/05/24 1026 3     Pain Loc --      Pain Education --      Exclude from Growth Chart --    No data found.  Updated Vital Signs BP 118/73 (BP Location: Right Arm)   Pulse 74   Temp 98.4 F (36.9 C) (Oral)   Resp 18   Ht 5\' 4"  (1.626 m)   Wt 160 lb (72.6 kg)   SpO2 100%   BMI 27.46 kg/m   Visual Acuity Right Eye Distance:   Left Eye Distance:   Bilateral Distance:    Right Eye Near:   Left Eye Near:    Bilateral Near:     Physical Exam Vitals and nursing note reviewed.  Constitutional:      Appearance: Normal appearance. She is normal weight.  HENT:     Head: Normocephalic and atraumatic.     Mouth/Throat:     Mouth: Mucous membranes are moist.     Pharynx: Oropharynx is clear.  Eyes:     Extraocular Movements: Extraocular movements intact.     Conjunctiva/sclera: Conjunctivae normal.     Pupils: Pupils are equal, round, and reactive to light.  Cardiovascular:     Rate and Rhythm: Normal rate and regular rhythm.     Pulses: Normal pulses.     Heart sounds: Normal heart sounds.  Pulmonary:     Effort: Pulmonary effort is normal.     Breath sounds: Normal breath sounds. No wheezing, rhonchi or rales.  Chest:     Chest wall: No tenderness.  Musculoskeletal:        General: Normal range of motion.     Cervical back: Normal range of motion and neck supple.     Comments: Right knee:  Skin:    General: Skin is warm and  dry.  Neurological:     General: No focal deficit present.     Mental Status: She is alert and oriented to person, place, and time. Mental status  is at baseline.  Psychiatric:        Mood and Affect: Mood normal.        Behavior: Behavior normal.      UC Treatments / Results  Labs (all labs ordered are listed, but only abnormal results are displayed) Labs Reviewed - No data to display  EKG   Radiology DG Knee Complete 4 Views Right Result Date: 02/05/2024 CLINICAL DATA:  right knee pain and anterior knee bruising EXAM: RIGHT KNEE - COMPLETE 4+ VIEW COMPARISON:  None Available. FINDINGS: No acute fracture or dislocation. No joint effusion. There is no evidence of arthropathy or other focal bone abnormality. Mild soft tissue swelling about the anterior knee. IMPRESSION: Mild soft tissue swelling about the anterior knee. Otherwise, no acute fracture or dislocation. Electronically Signed   By: Rance Burrows M.D.   On: 02/05/2024 11:18    Procedures Procedures (including critical care time)  Medications Ordered in UC Medications - No data to display  Initial Impression / Assessment and Plan / UC Course  I have reviewed the triage vital signs and the nursing notes.  Pertinent labs & imaging results that were available during my care of the patient were reviewed by me and considered in my medical decision making (see chart for details).     MDM: 1.  Acute pain of right knee-right knee x-ray results revealed above.  2.  Injury of right knee, initial encounter-Rx'd Celebrex 200 mg capsule: Take 1 capsule daily x 15 days advised patient to take medication as directed with food to completion.  Advised patient to RICE affected area of right knee for 30 minutes 3 times daily for the next 3 days.  Advised if symptoms worsen and/or unresolved please follow-up with your PCP, [facility], or here for further evaluation.  Contact information provided with this AVS today.   Final  Clinical Impressions(s) / UC Diagnoses   Final diagnoses:  Injury of right knee, initial encounter  Acute pain of right knee     Discharge Instructions      Advised patient to take medication as directed with food to completion.  Advised patient to RICE affected area of right knee for 30 minutes 3 times daily for the next 3 days.  Advised if symptoms worsen and/or unresolved please follow-up with your PCP, [facility], or here for further evaluation.  Contact information provided with this AVS today.     ED Prescriptions     Medication Sig Dispense Auth. Provider   celecoxib (CELEBREX) 200 MG capsule Take 1 capsule (200 mg total) by mouth daily for 15 days. 15 capsule Craige Patel, FNP      PDMP not reviewed this encounter.   Leonides Ramp, FNP 02/05/24 1201

## 2024-02-05 NOTE — Discharge Instructions (Addendum)
 Advised patient to take medication as directed with food to completion.  Advised patient to RICE affected area of right knee for 30 minutes 3 times daily for the next 3 days.  Advised if symptoms worsen and/or unresolved please follow-up with your PCP, [facility], or here for further evaluation.  Contact information provided with this AVS today.

## 2024-02-05 NOTE — ED Triage Notes (Signed)
 Patient states that she fell down the stairs this morning, injuring her right knee/right shin.  There is a bruise on shin.  Patient has taken Ibuprofen for pain.

## 2024-02-14 ENCOUNTER — Encounter: Payer: Self-pay | Admitting: Family Medicine

## 2024-02-22 ENCOUNTER — Other Ambulatory Visit: Payer: Self-pay | Admitting: Family Medicine

## 2024-03-14 ENCOUNTER — Ambulatory Visit: Admitting: Family Medicine

## 2024-04-01 ENCOUNTER — Encounter: Payer: Self-pay | Admitting: Family Medicine

## 2024-04-01 ENCOUNTER — Ambulatory Visit (INDEPENDENT_AMBULATORY_CARE_PROVIDER_SITE_OTHER): Admitting: Family Medicine

## 2024-04-01 VITALS — BP 110/59 | HR 80 | Ht 63.5 in | Wt 163.1 lb

## 2024-04-01 DIAGNOSIS — Z23 Encounter for immunization: Secondary | ICD-10-CM | POA: Diagnosis not present

## 2024-04-01 DIAGNOSIS — G2581 Restless legs syndrome: Secondary | ICD-10-CM | POA: Diagnosis not present

## 2024-04-01 DIAGNOSIS — E039 Hypothyroidism, unspecified: Secondary | ICD-10-CM | POA: Diagnosis not present

## 2024-04-01 DIAGNOSIS — F411 Generalized anxiety disorder: Secondary | ICD-10-CM | POA: Diagnosis not present

## 2024-04-01 DIAGNOSIS — G43009 Migraine without aura, not intractable, without status migrainosus: Secondary | ICD-10-CM

## 2024-04-01 MED ORDER — SUMATRIPTAN SUCCINATE 100 MG PO TABS: 100.0000 mg | ORAL_TABLET | Freq: Every day | ORAL | 3 refills | Status: DC | PRN

## 2024-04-01 NOTE — Assessment & Plan Note (Signed)
 She is currently taking 3 tabs weekly we discussed decreasing down to half a tab those 3 days and then tapering down to twice a week and then once a week and then stopping completely.

## 2024-04-01 NOTE — Assessment & Plan Note (Signed)
 More freq HA. Will change imitrex  to 100mg   see if more effective after initial dose.  We discussed that we can also look at alternative triptans if this 1 just does not seem to be as effective and she is having to repeat dosing frequently but I want to start by maximizing therapy before we switch.  As it does seem to help when she uses it.  We also discussed that there are prophylactic options as well if she is having more frequent headaches she wants to work on trying to improve her rescue medicine first which I think is perfectly reasonable

## 2024-04-01 NOTE — Progress Notes (Signed)
 Established Patient Office Visit  Subjective  Patient ID: Terri [name], female    DOB: 1970/10/29  Age: 53 y.o. MRN: [id number]  Chief Complaint  Patient presents with   Medical Management of Chronic Issues    When pt was last seen by Dr. Bevin the following was discussed:  GAD (generalized anxiety disorder) - Primary     Pt doing very well on celexa , we will continue at the 10mg  dose - plan to see me in May (or March if she wants to get off medication sooner)  Pt stated that she would rather stop the Niacin  and revisit stopping the Celexa  at another time.  Migraines have gotten worse since she has been on Sumatriptan  50 mg. She doesn't feel that the medication has contributed to her headaches gettin    HPI  GAD - she si doing really well.  She has weaned down to citalopram  1 tab 3 days/week but would like to continue to get off.  At the time she feels like her biggest stressor was her job and she has quit that job since then as this found something else and feels like her symptoms are under much better control.  Migraines - more frequent  in the last 6 months.  Her biggest trigger trigger is low barometric pressure that almost always will trigger headache for her so last week she had a headache almost every day she currently takes Imitrex  50 mg but she does typically repeat the dose every 2 hours during the day.  She was not sure how much of the medication she could actually take.  RLS has been worse lately.  She says it is always usually worse in the evenings but anytime she has been sitting for a while she just feels like she has to jerk or move her legs.  She did quit taking her iron supplement a few months ago she said she ran out and just did not replace it.  She has been walking for exercise recently and feels like that has helped some.  We did discuss her niacin  as well.   Fel down the stairs recently .      ROS    Objective:     BP (!) 110/59   Pulse 80   Ht  5' 3.5 (1.613 m)   Wt 163 lb 1.9 oz (74 kg)   SpO2 94%   BMI 28.44 kg/m    Physical Exam Vitals and nursing note reviewed.  Constitutional:      Appearance: Normal appearance.  HENT:     Head: Normocephalic and atraumatic.   Eyes:     Conjunctiva/sclera: Conjunctivae normal.    Cardiovascular:     Rate and Rhythm: Normal rate and regular rhythm.  Pulmonary:     Effort: Pulmonary effort is normal.     Breath sounds: Normal breath sounds.   Skin:    General: Skin is warm and dry.   Neurological:     Mental Status: She is alert.   Psychiatric:        Mood and Affect: Mood normal.      No results found for any visits on 04/01/24.    The 10-year ASCVD risk score (Arnett DK, et al., 2019) is: 1.2%    Assessment & Plan:   Problem List Items Addressed This Visit       Cardiovascular and Mediastinum   Migraine   More freq HA. Will change imitrex  to 100mg   see if more effective after  initial dose.  We discussed that we can also look at alternative triptans if this 1 just does not seem to be as effective and she is having to repeat dosing frequently but I want to start by maximizing therapy before we switch.  As it does seem to help when she uses it.  We also discussed that there are prophylactic options as well if she is having more frequent headaches she wants to work on trying to improve her rescue medicine first which I think is perfectly reasonable      Relevant Medications   SUMAtriptan  (IMITREX ) 100 MG tablet     Endocrine   Hypothyroidism   Recent bump up and headaches and doing to make sure that her thyroid  levels are adequate.  She is currently on levothyroxine 50 mcg.        Other   RLS (restless legs syndrome)   She has has a ramp-up in restless leg which I strongly suspect may be due from an adequate iron levels.  Will recheck levels today want to make sure that ferritin is at least 60.  If not we will restart iron supplementation.  She really does  not want to do any additional medications at this point in time.  Recent addition of routine exercise has been helpful as well.      Relevant Orders   TSH   Fe+TIBC+Fer   GAD (generalized anxiety disorder)   She is currently taking 3 tabs weekly we discussed decreasing down to half a tab those 3 days and then tapering down to twice a week and then once a week and then stopping completely.      Relevant Orders   TSH   Fe+TIBC+Fer   Other Visit Diagnoses       Encounter for immunization    -  Primary   Relevant Orders   Tdap vaccine greater than or equal to 7yo IM (Completed)       Return in about 6 months (around 10/01/2024) for Migraines, and thyroid  check/Whitney Crain.    Dorothyann Byars, MD

## 2024-04-01 NOTE — Assessment & Plan Note (Signed)
 She has has a ramp-up in restless leg which I strongly suspect may be due from an adequate iron levels.  Will recheck levels today want to make sure that ferritin is at least 60.  If not we will restart iron supplementation.  She really does not want to do any additional medications at this point in time.  Recent addition of routine exercise has been helpful as well.

## 2024-04-01 NOTE — Assessment & Plan Note (Signed)
 Recent bump up and headaches and doing to make sure that her thyroid  levels are adequate.  She is currently on levothyroxine 50 mcg.

## 2024-04-01 NOTE — Progress Notes (Signed)
 When pt was last seen by Dr. Bevin the following was discussed:  GAD (generalized anxiety disorder) - Primary     Pt doing very well on celexa , we will continue at the 10mg  dose - plan to see me in May (or March if she wants to get off medication sooner)  Pt stated that she would rather stop the Niacin  and revisit stopping the Celexa  at another time.  Migraines have gotten worse since she has been on Sumatriptan  50 mg. She doesn't feel that the medication has contributed to her headaches getting worse she said that the medication does work when she takes it.

## 2024-04-02 ENCOUNTER — Ambulatory Visit: Payer: Self-pay | Admitting: Family Medicine

## 2024-04-02 LAB — TSH: TSH: 1.67 u[IU]/mL (ref 0.450–4.500)

## 2024-04-02 LAB — IRON,TIBC AND FERRITIN PANEL
Ferritin: 20 ng/mL (ref 15–150)
Iron Saturation: 24 % (ref 15–55)
Iron: 107 ug/dL (ref 27–159)
Total Iron Binding Capacity: 454 ug/dL — ABNORMAL HIGH (ref 250–450)
UIBC: 347 ug/dL (ref 131–425)

## 2024-04-02 NOTE — Progress Notes (Signed)
 Hi Katera, your ferritin is low around 20.  I know we had talked about that with restless leg we like to get your ferritin above 60.  I recommend starting your iron supplement again.  It sounds like you may have 1 that you have taken before that you did okay with.  If you tend to get a lot of constipation with iron you can also consider taking a stool softener or switch to a vegan iron which is plant-based.  Sometimes that can be more gentle on the stomach.  Also work on iron rich diet and you can cook with iron skillet etc. sometimes that can help as well.  Your thyroid  level looks perfect.

## 2024-06-06 ENCOUNTER — Other Ambulatory Visit: Payer: Self-pay

## 2024-06-06 MED ORDER — BUPROPION HCL ER (XL) 300 MG PO TB24: 300.0000 mg | ORAL_TABLET | Freq: Every day | ORAL | 0 refills | Status: DC

## 2024-06-17 ENCOUNTER — Other Ambulatory Visit (HOSPITAL_BASED_OUTPATIENT_CLINIC_OR_DEPARTMENT_OTHER): Payer: Self-pay

## 2024-08-08 ENCOUNTER — Ambulatory Visit: Payer: Self-pay

## 2024-08-08 ENCOUNTER — Ambulatory Visit
Admission: RE | Admit: 2024-08-08 | Discharge: 2024-08-08 | Disposition: A | Discharge status: Home / Self Care | Source: Ambulatory Visit | Attending: Family Medicine | Admitting: Family Medicine

## 2024-08-08 VITALS — BP 126/80 | HR 80 | Temp 98.3°F | Resp 18

## 2024-08-08 DIAGNOSIS — R319 Hematuria, unspecified: Secondary | ICD-10-CM | POA: Diagnosis not present

## 2024-08-08 LAB — POCT URINALYSIS DIP (MANUAL ENTRY)
Bilirubin, UA: NEGATIVE
Glucose, UA: NEGATIVE mg/dL
Ketones, POC UA: NEGATIVE mg/dL
Leukocytes, UA: NEGATIVE
Nitrite, UA: NEGATIVE
Protein Ur, POC: NEGATIVE mg/dL
Spec Grav, UA: 1.015 (ref 1.010–1.025)
Urobilinogen, UA: 0.2 U/dL
pH, UA: 7 (ref 5.0–8.0)

## 2024-08-08 NOTE — Telephone Encounter (Signed)
 FYI Only or Action Required?: FYI only for provider: appointment scheduled on 10/31 with UC.  Patient was last seen in primary care on 04/01/2024 by Terri Dorothyann BIRCH, MD.  Called Nurse Triage reporting Urinary Frequency.  Symptoms began several days ago.  Interventions attempted: Rest, hydration, or home remedies.  Symptoms are: gradually worsening.  Triage Disposition: See Physician Within 24 Hours  Patient/caregiver understands and will follow disposition?: Yes  Copied from CRM #[id number]. Topic: Clinical - Red Word Triage >> Aug 08, 2024 12:47 PM Victoria B wrote: Patient has cramping in abdomen and frequent urinatin Reason for Disposition . Urinating more frequently than usual (i.e., frequency) OR new-onset of the feeling of an urgent need to urinate (i.e., urgency)  Answer Assessment - Initial Assessment Questions Abdominal cramping and urinary frequency for almost a week. Feels like menstrual cramps. Had drank a lot of water and felt that was why at first.  Increase in sexual activity in the last few weeks. Denies diabetes.  No appt with PCP office within Dispo. UC appt found for this afternoon. ED/UC precautions understood.   1. SYMPTOM: What's the main symptom you're concerned about? (e.g., frequency, incontinence)     Urinary frequency  2. ONSET: When did the  cramping and frequency  start?     A couple days  3. PAIN: Is there any pain? If Yes, ask: How bad is it? (Scale: 1-10; mild, moderate, severe)     3/10- steady cramping, with mild relief with urination  4. CAUSE: What do you think is causing the symptoms?     UTI 5. OTHER SYMPTOMS: Do you have any other symptoms? (e.g., blood in urine, fever, flank pain, pain with urination)     Denies fever, denies burning, odor, or abnormal discharge 6. PREGNANCY: Is there any chance you are pregnant? When was your last menstrual period?     Last period 2010  Protocols used: Urinary Symptoms-A-AH

## 2024-08-08 NOTE — Discharge Instructions (Addendum)
 Advised patient UA revealed trace blood only today.  Encouraged to increase daily water intake to 64 ounces per day 7 days/week.  Advised if symptoms worsen and/or unresolved please follow-up with your PCP, [facility], or here for further evaluation.

## 2024-08-08 NOTE — ED Provider Notes (Signed)
 [name]    CSN: [id number] Arrival date & time: 08/08/24  1411      History   Chief Complaint Chief Complaint  Patient presents with   Urinary Frequency    bladder cramps and urinary frequency - Entered by patient    HPI Terri [name] is a 53 y.o. female.   HPI 53 year old female presents with urinary frequency, urgency and lower abdominal cramping for 1 weekend.  Patient endorses vaginal itching.  Past Medical History:  Diagnosis Date   HLD (hyperlipidemia)    Hypothyroidism     Patient Active Problem List   Diagnosis Date Noted   RLS (restless legs syndrome) 04/01/2024   Panic attacks 08/27/2023   Migraine 08/27/2023   High cholesterol 08/27/2023   Depression 03/07/2023   Pruritic condition 02/07/2023   Tingling sensation 02/07/2023   Vitamin B 12 deficiency 02/07/2023   Seasonal allergies 01/13/2019   Pure hypertriglyceridemia 02/14/2016   GAD (generalized anxiety disorder) 02/11/2016   Cervical radiculopathy 02/11/2016   Gastroesophageal reflux disease 02/11/2016   Hyperlipidemia 02/11/2016   Hypothyroidism 02/11/2016   Insomnia 02/11/2016   Pendulous breast 02/11/2016    Past Surgical History:  Procedure Laterality Date   CESAREAN SECTION     CHOLECYSTECTOMY     ENDOMETRIAL ABLATION      OB History   No obstetric history on file.      Home Medications    Prior to Admission medications   Medication Sig Start Date End Date Taking? Authorizing Provider  ALPRAZolam  (XANAX ) 0.5 MG tablet Take 1 tablet (0.5 mg total) by mouth daily as needed for anxiety. 08/27/23   Bevin Bernice RAMAN, DO  Bacillus Coagulans-Inulin (PROBIOTIC) 1-250 BILLION-MG CAPS Take by mouth.    [provider]  buPROPion  (WELLBUTRIN  XL) 300 MG 24 hr tablet Take 1 tablet (300 mg total) by mouth daily. 06/06/24   Crain, Whitney L, PA  calcium carbonate (OS-CAL) 1250 (500 Ca) MG chewable tablet Chew 1 tablet by mouth. 12/13/17   [provider]   Calcium-Magnesium-Vitamin D  (CITRACAL CALCIUM+D) 600-40-500 MG-MG-UNIT TB24 Take by mouth.    [provider]  citalopram  (CELEXA ) 10 MG tablet Take 1 tablet (10 mg total) by mouth daily. 10/05/23   Bevin Bernice RAMAN, DO  estradiol (ESTRACE) 2 MG tablet Take 2 mg by mouth daily.    [provider]  levothyroxine (SYNTHROID) 50 MCG tablet Take 50 mcg by mouth daily before breakfast. 11/08/15   [provider]  MAGNESIUM PO Take by mouth. 1350mg     [provider]  metroNIDAZOLE (METROCREAM) 0.75 % cream Apply topically 2 (two) times daily.    [provider]  Multiple Vitamin (MULTIVITAMIN) tablet Take 1 tablet by mouth daily.    [provider]  progesterone (PROMETRIUM) 100 MG capsule Take 100 mg by mouth daily.    [provider]  SUMAtriptan  (IMITREX ) 100 MG tablet Take 1 tablet (100 mg total) by mouth daily as needed for migraine. May repeat in 2 hours if headache persists or recurs. 04/01/24   Alvan Dorothyann BIRCH, MD    Family History Family History  Problem Relation Age of Onset   Heart attack Father    Allergic rhinitis Son     Social History Social History   Tobacco Use   Smoking status: Never    Passive exposure: Never   Smokeless tobacco: Never  Vaping Use   Vaping status: Never Used  Substance Use Topics   Alcohol use: Yes  Comment: occas.   Drug use: Never     Allergies   Wound dressing adhesive and Latex   Review of Systems Review of Systems  Genitourinary:  Positive for frequency.  All other systems reviewed and are negative.    Physical Exam Triage Vital Signs ED Triage Vitals  Encounter Vitals Group     BP      Girls Systolic BP Percentile      Girls Diastolic BP Percentile      Boys Systolic BP Percentile      Boys Diastolic BP Percentile      Pulse      Resp      Temp      Temp src      SpO2      Weight      Height      Head Circumference      Peak Flow      Pain Score       Pain Loc      Pain Education      Exclude from Growth Chart    No data found.  Updated Vital Signs BP 126/80 (BP Location: Right Arm)   Pulse 80   Temp 98.3 F (36.8 C) (Oral)   Resp 18   SpO2 100%   Visual Acuity Right Eye Distance:   Left Eye Distance:   Bilateral Distance:    Right Eye Near:   Left Eye Near:    Bilateral Near:     Physical Exam Vitals and nursing note reviewed.  Constitutional:      General: She is not in acute distress.    Appearance: Normal appearance. She is normal weight. She is not ill-appearing.  HENT:     Head: Normocephalic and atraumatic.     Mouth/Throat:     Mouth: Mucous membranes are moist.     Pharynx: Oropharynx is clear.  Eyes:     Extraocular Movements: Extraocular movements intact.     Conjunctiva/sclera: Conjunctivae normal.     Pupils: Pupils are equal, round, and reactive to light.  Cardiovascular:     Rate and Rhythm: Normal rate and regular rhythm.     Pulses: Normal pulses.     Heart sounds: Normal heart sounds.  Pulmonary:     Effort: Pulmonary effort is normal.     Breath sounds: Normal breath sounds. No wheezing, rhonchi or rales.  Musculoskeletal:        General: Normal range of motion.  Skin:    General: Skin is warm and dry.  Neurological:     General: No focal deficit present.     Mental Status: She is alert and oriented to person, place, and time. Mental status is at baseline.  Psychiatric:        Mood and Affect: Mood normal.        Behavior: Behavior normal.      UC Treatments / Results  Labs (all labs ordered are listed, but only abnormal results are displayed) Labs Reviewed  POCT URINALYSIS DIP (MANUAL ENTRY) - Abnormal; Notable for the following components:      Result Value   Blood, UA trace-intact (*)    All other components within normal limits    EKG   Radiology No results found.  Procedures Procedures (including critical care time)  Medications Ordered in UC Medications - No  data to display  Initial Impression / Assessment and Plan / UC Course  I have reviewed the triage vital signs and the nursing  notes.  Pertinent labs & imaging results that were available during my care of the patient were reviewed by me and considered in my medical decision making (see chart for details).     MDM: 1.  Hematuria, unspecified type-UA revealed above, patient advised. Advised patient UA revealed trace blood only today.  Encouraged to increase daily water intake to 64 ounces per day 7 days/week.  Advised if symptoms worsen and/or unresolved please follow-up with your PCP, [facility] urology, or here for further evaluation.  Patient discharged home, hemodynamically stable. Final Clinical Impressions(s) / UC Diagnoses   Final diagnoses:  Hematuria, unspecified type     Discharge Instructions      Advised patient UA revealed trace blood only today.  Encouraged to increase daily water intake to 64 ounces per day 7 days/week.  Advised if symptoms worsen and/or unresolved please follow-up with your PCP, [facility] urology, or here for further evaluation.     ED Prescriptions   None    PDMP not reviewed this encounter.   Teddy Sharper, FNP 08/08/24 [phone]

## 2024-08-08 NOTE — ED Triage Notes (Signed)
 Pt c/o urinary frequency, urgency, and lower abd cramping x1 week. Symptoms worsened today and now has some nausea as well. Endorses vaginal itching.

## 2024-08-20 ENCOUNTER — Other Ambulatory Visit: Payer: Self-pay | Admitting: Urgent Care

## 2024-08-20 ENCOUNTER — Telehealth: Payer: Self-pay

## 2024-08-20 MED ORDER — LEVOTHYROXINE SODIUM 50 MCG PO TABS
50.0000 ug | ORAL_TABLET | Freq: Every day | ORAL | 1 refills | Status: AC
Start: 2024-08-20 — End: ?

## 2024-08-20 NOTE — Telephone Encounter (Signed)
 Copied from CRM [id number]. Topic: Clinical - Medication Refill >> Aug 20, 2024  9:17 AM Travis F wrote: Medication: levothyroxine (SYNTHROID) 50 MCG tablet [[id number]]  Has the patient contacted their pharmacy? Yes  (Agent: If yes, when and what did the pharmacy advise?) contact office   This is the patient's preferred pharmacy:  [id number] - [place], [place] - [address] [place] [address] Phone: [phone] Fax: [phone]  Is this the correct pharmacy for this prescription? Yes If no, delete pharmacy and type the correct one.   Has the prescription been filled recently? Yes  Is the patient out of the medication? No  Has the patient been seen for an appointment in the last year OR does the patient have an upcoming appointment? Yes  Can we respond through MyChart? No  Agent: Please be advised that Rx refills may take up to 3 business days. We ask that you follow-up with your pharmacy.  Patient is requesting this be put in urgently as she will need the medication tomorrow.

## 2024-08-20 NOTE — Telephone Encounter (Signed)
 Copied from CRM [id number]. Topic: Clinical - Prescription Issue >> Aug 20, 2024  4:13 PM Everette C wrote: Reason for CRM: Almetta with [facility] has called to follow up on notification of a manufacturer change for the patient's prescription of levothyroxine (SYNTHROID) 50 MCG tablet  The manufacturer will be changing Former/From: [employer]  Current/To: [employer]   Please contact Makenzie further if needed

## 2024-08-21 ENCOUNTER — Telehealth: Payer: Self-pay

## 2024-08-21 NOTE — Telephone Encounter (Signed)
 Noted. Am I supposed to do anything with this or was this just an FYI?

## 2024-08-21 NOTE — Telephone Encounter (Signed)
 I believe this is a duplicate message regarding patient- mfg change with Levothyroxine

## 2024-08-21 NOTE — Telephone Encounter (Signed)
 Yes, whatever manufacturer they use is fine. Please fill script and provide to patient.

## 2024-08-21 NOTE — Telephone Encounter (Signed)
 Copied from CRM [id number]. Topic: Clinical - Prescription Issue >> Aug 20, 2024  4:13 PM Terri [name] wrote: Reason for CRM: Terri [name] with [name] has called to follow up on notification of a manufacturer change for the patient's prescription of levothyroxine (SYNTHROID) 50 MCG tablet  The manufacturer will be changing Former/From: Amneal  Current/To: [employer]   Please contact Terri [name] further if needed >> Aug 21, 2024  1:02 PM Terri [name] wrote: Terri [name] from [facility] calling to follow up on med manufacture switch. They will need approval to process the pt refill. They have also sent a fax to request a approval.. Pt is completely out of medication.

## 2024-08-22 NOTE — Telephone Encounter (Signed)
 Copied from CRM [id number]. Topic: Clinical - Prescription Issue >> Aug 22, 2024  9:01 AM Jasmin G wrote: Reason for CRM: Staff from [facility] [id number] - [place], [place] - [address] called to request signature needed from Terri [name] needed for approval for hange of manufacturer for levothyroxine (SYNTHROID) 50 MCG tablet. Please fax at  [phone].

## 2024-08-22 NOTE — Telephone Encounter (Signed)
 Spoke with sara, pharmacist, to let her know that [facility] does not want to sign a blanket consent to allow them to fill all of our patients Rx with the new manufacturer.

## 2024-08-22 NOTE — Telephone Encounter (Signed)
 This is being addressed in a separate message by provider.

## 2024-08-22 NOTE — Telephone Encounter (Signed)
 Representative with [facility] states she had just spoken with Christal and that they require the form be completed and return documenting the authorization of the change of MFG and she was told by Christal that the form would be completed and faxed.

## 2024-09-02 ENCOUNTER — Other Ambulatory Visit: Payer: Self-pay | Admitting: Family Medicine

## 2024-09-02 DIAGNOSIS — G43009 Migraine without aura, not intractable, without status migrainosus: Secondary | ICD-10-CM

## 2024-09-04 ENCOUNTER — Other Ambulatory Visit: Payer: Self-pay | Admitting: Urgent Care

## 2024-09-19 LAB — HM MAMMOGRAPHY: HM Mammogram: NORMAL

## 2024-09-30 ENCOUNTER — Encounter: Payer: Self-pay | Admitting: Urgent Care

## 2024-09-30 ENCOUNTER — Telehealth: Payer: Self-pay

## 2024-09-30 ENCOUNTER — Ambulatory Visit: Admitting: Urgent Care

## 2024-09-30 VITALS — BP 114/79 | HR 82 | Ht 63.5 in | Wt 147.0 lb

## 2024-09-30 DIAGNOSIS — E785 Hyperlipidemia, unspecified: Secondary | ICD-10-CM | POA: Diagnosis not present

## 2024-09-30 DIAGNOSIS — E559 Vitamin D deficiency, unspecified: Secondary | ICD-10-CM

## 2024-09-30 DIAGNOSIS — E039 Hypothyroidism, unspecified: Secondary | ICD-10-CM

## 2024-09-30 DIAGNOSIS — G43009 Migraine without aura, not intractable, without status migrainosus: Secondary | ICD-10-CM

## 2024-09-30 DIAGNOSIS — Z79899 Other long term (current) drug therapy: Secondary | ICD-10-CM

## 2024-09-30 DIAGNOSIS — F5101 Primary insomnia: Secondary | ICD-10-CM

## 2024-09-30 DIAGNOSIS — F411 Generalized anxiety disorder: Secondary | ICD-10-CM | POA: Diagnosis not present

## 2024-09-30 DIAGNOSIS — Z23 Encounter for immunization: Secondary | ICD-10-CM

## 2024-09-30 DIAGNOSIS — E538 Deficiency of other specified B group vitamins: Secondary | ICD-10-CM

## 2024-09-30 MED ORDER — SUMATRIPTAN SUCCINATE 100 MG PO TABS: 100.0000 mg | ORAL_TABLET | Freq: Once | ORAL | 0 refills | Status: DC

## 2024-09-30 MED ORDER — BUPROPION HCL ER (XL) 300 MG PO TB24: 300.0000 mg | ORAL_TABLET | Freq: Every day | ORAL | 3 refills | Status: AC

## 2024-09-30 NOTE — Progress Notes (Signed)
 "  Established Patient Office Visit  Subjective:  Patient ID: Terri [name], female    DOB: 1971/09/02  Age: 53 y.o. MRN: [id number]  Chief Complaint  Patient presents with   Follow-up    headaches    HPI  Discussed the use of AI scribe software for clinical note transcription with the patient, who [name] verbal consent to proceed.  History of Present Illness   Terri [name] is a 53 year old female with seasonal depression who presents for medication management and follow-up.  She has been managing her seasonal depression with Wellbutrin  for several years, currently on a 300 mg dose, which was increased from 150 mg by her gynecologist. Despite the medication's effectiveness, she experiences symptoms like altered sleeping patterns and fatigue, particularly during winter. She attributes these issues to early darkness and the cessation of yoga due to financial constraints.  She uses alprazolam  sporadically for emergencies, with a prescription of 20 tablets lasting about a year, and recently refilled it. Zolpidem is taken as needed for sleep, usually half a pill for four hours of sleep, with sleep issues worsening after stopping yoga.  She has a history of hypothyroidism, managed with levothyroxine  for 10-15 years, initially diagnosed due to persistent tiredness. She does not recall specific diagnostic tests confirming the cause of her hypothyroidism.  Migraines are managed with sumatriptan  as needed, initially requiring frequent use but now less often. She takes half a pill early to manage onset, experiencing some nausea with the medication but preferring it over the headache.  Her medication regimen includes estradiol, progesterone, calcium, magnesium, vitamin D , a multivitamin, and iron supplements. She previously supplemented vitamin B12 and vitamin D  due to deficiencies but is not currently doing so. She takes a probiotic for gut health and uses metronidazole cream.  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 She has a sister visiting from [place] , with her parents recently moving nearby.      Patient Active Problem List   Diagnosis Date Noted   RLS (restless legs syndrome) 04/01/2024   Panic attacks 08/27/2023   Migraine 08/27/2023   High cholesterol 08/27/2023   Depression 03/07/2023   Pruritic condition 02/07/2023   Tingling sensation 02/07/2023   Vitamin B 12 deficiency 02/07/2023   Seasonal allergies 01/13/2019   Pure hypertriglyceridemia 02/14/2016   GAD (generalized anxiety disorder) 02/11/2016   Cervical radiculopathy 02/11/2016   Gastroesophageal reflux disease 02/11/2016   Hyperlipidemia 02/11/2016   Hypothyroidism 02/11/2016   Insomnia 02/11/2016   Pendulous breast 02/11/2016   Past Medical History:  Diagnosis Date   HLD (hyperlipidemia)    Hypothyroidism    Past Surgical History:  Procedure Laterality Date   CESAREAN SECTION     CHOLECYSTECTOMY     ENDOMETRIAL ABLATION     Social History[1]    ROS: as noted in HPI  Objective:     BP 114/79   Pulse 82   Ht 5' 3.5 (1.613 m)   Wt 147 lb (66.7 kg)   SpO2 99%   BMI 25.63 kg/m  BP Readings from Last 3 Encounters:  09/30/24 114/79  08/08/24 126/80  04/01/24 (!) 110/59   Wt Readings from Last 3 Encounters:  09/30/24 147 lb (66.7 kg)  04/01/24 163 lb 1.9 oz (74 kg)  02/05/24 160 lb (72.6 kg)      Physical Exam Vitals and nursing note reviewed.  Constitutional:      General: She is not in acute  distress.    Appearance: Normal appearance. She is not ill-appearing, toxic-appearing or diaphoretic.  HENT:     Head: Normocephalic and atraumatic.     Right Ear: Tympanic membrane, ear canal and external ear normal. There is no impacted cerumen.     Left Ear: Tympanic membrane, ear canal and external ear normal. There is no impacted cerumen.     Nose: Nose normal.     Mouth/Throat:     Mouth: Mucous membranes are  moist.     Pharynx: Oropharynx is clear. No oropharyngeal exudate or posterior oropharyngeal erythema.  Eyes:     General: No scleral icterus.       Right eye: No discharge.        Left eye: No discharge.     Extraocular Movements: Extraocular movements intact.     Pupils: Pupils are equal, round, and reactive to light.  Neck:     Thyroid : No thyroid  mass, thyromegaly or thyroid  tenderness.  Cardiovascular:     Rate and Rhythm: Normal rate and regular rhythm.     Pulses: Normal pulses.     Heart sounds: No murmur heard. Pulmonary:     Effort: Pulmonary effort is normal. No respiratory distress.     Breath sounds: Normal breath sounds. No stridor. No wheezing or rhonchi.  Musculoskeletal:     Cervical back: Normal range of motion and neck supple. No rigidity or tenderness.     Right lower leg: No edema.     Left lower leg: No edema.  Lymphadenopathy:     Cervical: No cervical adenopathy.  Skin:    General: Skin is warm and dry.     Coloration: Skin is not jaundiced.     Findings: No bruising, erythema or rash.  Neurological:     General: No focal deficit present.     Mental Status: She is alert and oriented to person, place, and time.     Sensory: No sensory deficit.     Motor: No weakness.  Psychiatric:        Mood and Affect: Mood normal.        Behavior: Behavior normal.        09/30/2024    8:21 AM 04/01/2024    2:10 PM 08/17/2023    9:32 AM 02/07/2023    8:48 AM  Depression screen PHQ 2/9  Decreased Interest 1 1 1 1   Down, Depressed, Hopeless 1 1 1 1   PHQ - 2 Score 2 2 2 2   Altered sleeping 2 0 2 2  Tired, decreased energy 1 1 1    Change in appetite 1 1 3 1   Feeling bad or failure about yourself  0 0 1 0  Trouble concentrating 1 2 3 2   Moving slowly or fidgety/restless 1 1 1 1   Suicidal thoughts 0 0 0 0  PHQ-9 Score 8 7  13  8    Difficult doing work/chores Somewhat difficult Somewhat difficult Somewhat difficult Not difficult at all     Data saved with a  previous flowsheet row definition      09/30/2024    8:22 AM 04/01/2024    2:11 PM 08/17/2023    9:32 AM 02/07/2023    8:48 AM  GAD 7 : Generalized Anxiety Score  Nervous, Anxious, on Edge 1 1 1 1   Control/stop worrying 0 0 1 0  Worry too much - different things 0 0 0 0  Trouble relaxing 2 1 2 1   Restless 1 1 2 1   Easily  annoyed or irritable 0  0 0  Afraid - awful might happen 0 2 1 0  Total GAD 7 Score 4  7 3   Anxiety Difficulty Somewhat difficult Not difficult at all Somewhat difficult Not difficult at all       The 10-year ASCVD risk score (Arnett DK, et al., 2019) is: 1.6%  Assessment & Plan:  GAD (generalized anxiety disorder) -     buPROPion  HCl ER (XL); Take 1 tablet (300 mg total) by mouth daily.  Dispense: 90 tablet; Refill: 3  Migraine without aura and without status migrainosus, not intractable  Hypothyroidism, unspecified type -     TSH + free T4  B12 deficiency -     B12 and Folate Panel  Vitamin D  deficiency -     VITAMIN D  25 Hydroxy (Vit-D Deficiency, Fractures)  Hyperlipidemia, unspecified hyperlipidemia type -     Lipid panel  Long-term use of high-risk medication -     CMP14+EGFR -     CBC with Differential/Platelet -     Hemoglobin A1c  Immunization due -     Pneumococcal conjugate vaccine 20-valent  Primary insomnia  Assessment and Plan    Migraine without aura Intermittent migraines influenced by weather. Sumatriptan  effective with tolerable nausea. Noted dry mouth. - Called in 11 refills of sumatriptan  for a year's supply. - Discussed potential new migraine medications if current treatment becomes ineffective.  Generalized anxiety disorder Anxiety managed with alprazolam  as needed. - Continue alprazolam  as needed for anxiety.  Seasonal depressive disorder Seasonal depression managed with Wellbutrin , effective and well-tolerated. Symptoms include altered sleep and fatigue. - Continue Wellbutrin  as prescribed. - Recommended online  exercise programs like [employer] for physical activity and mood improvement.  Insomnia Chronic insomnia, worsened recently. Zolpidem used as needed, half a pill typically sufficient. - Continue zolpidem as needed for sleep.  Hypothyroidism Long-standing hypothyroidism managed with levothyroxine . Thyroid  function to be re-evaluated with current labs. - Ordered thyroid  function tests. - Will adjust levothyroxine  dosage based on lab results.  Hyperlipidemia LDL slightly above normal, no medication required.  Vitamin B12 deficiency Previously noted deficiency with tingling symptoms. Not currently supplementing. - Ordered vitamin B12 level.  Vitamin D  deficiency Previously noted deficiency. Not currently supplementing.  General health maintenance Routine health maintenance discussed. Mammogram and Pap smear normal. Pneumonia vaccine due. - Administered pneumonia vaccine. - Ensure mammogram and Pap smear results are documented.         Return in about 1 year (around 09/30/2025) for Annual Physical.   Terri LITTIE Gave, PA     [1]  Social History Tobacco Use   Smoking status: Never    Passive exposure: Never   Smokeless tobacco: Never  Vaping Use   Vaping status: Never Used  Substance Use Topics   Alcohol use: Yes    Comment: occas.   Drug use: Never   "

## 2024-09-30 NOTE — Patient Instructions (Signed)
 Continue all medications as ordered. We updated your labs today and pneumonia vaccine  Follow up with me in one year for annual physical, sooner as needed.

## 2024-09-30 NOTE — Telephone Encounter (Signed)
 Copied from CRM [id number]. Topic: Clinical - Prescription Issue >> Sep 30, 2024 10:09 AM Mercer PEDLAR wrote: Reason for CRM: Tyrell is calling from [facility] for clarification on SUMAtriptan  (IMITREX ) 100 MG tablet. She states that the direction state May repeat in 2 hours if headache persists or recurs. But only one tablet was prescribed.   [id number] - [place], [place] - [address], [place] [address] Phone: [phone]  Fax: [phone]

## 2024-10-01 ENCOUNTER — Ambulatory Visit: Payer: Self-pay | Admitting: Urgent Care

## 2024-10-01 LAB — CBC WITH DIFFERENTIAL/PLATELET
Basophils Absolute: 0 x10E3/uL (ref 0.0–0.2)
Basos: 1 %
EOS (ABSOLUTE): 0.1 x10E3/uL (ref 0.0–0.4)
Eos: 1 %
Hematocrit: 39 % (ref 34.0–46.6)
Hemoglobin: 13 g/dL (ref 11.1–15.9)
Immature Grans (Abs): 0 x10E3/uL (ref 0.0–0.1)
Immature Granulocytes: 0 %
Lymphocytes Absolute: 2.7 x10E3/uL (ref 0.7–3.1)
Lymphs: 40 %
MCH: 31 pg (ref 26.6–33.0)
MCHC: 33.3 g/dL (ref 31.5–35.7)
MCV: 93 fL (ref 79–97)
Monocytes Absolute: 0.4 x10E3/uL (ref 0.1–0.9)
Monocytes: 6 %
Neutrophils Absolute: 3.5 x10E3/uL (ref 1.4–7.0)
Neutrophils: 52 %
Platelets: 271 x10E3/uL (ref 150–450)
RBC: 4.19 x10E6/uL (ref 3.77–5.28)
RDW: 12.1 % (ref 11.7–15.4)
WBC: 6.7 x10E3/uL (ref 3.4–10.8)

## 2024-10-01 LAB — CMP14+EGFR
ALT: 25 IU/L (ref 0–32)
AST: 27 IU/L (ref 0–40)
Albumin: 4.1 g/dL (ref 3.8–4.9)
Alkaline Phosphatase: 73 IU/L (ref 49–135)
BUN/Creatinine Ratio: 10 (ref 9–23)
BUN: 9 mg/dL (ref 6–24)
Bilirubin Total: 0.2 mg/dL (ref 0.0–1.2)
CO2: 26 mmol/L (ref 20–29)
Calcium: 9.7 mg/dL (ref 8.7–10.2)
Chloride: 101 mmol/L (ref 96–106)
Creatinine, Ser: 0.93 mg/dL (ref 0.57–1.00)
Globulin, Total: 2.3 g/dL (ref 1.5–4.5)
Glucose: 89 mg/dL (ref 70–99)
Potassium: 4.4 mmol/L (ref 3.5–5.2)
Sodium: 141 mmol/L (ref 134–144)
Total Protein: 6.4 g/dL (ref 6.0–8.5)
eGFR: 73 mL/min/1.73

## 2024-10-01 LAB — B12 AND FOLATE PANEL
Folate: >20 ng/mL
Vitamin B-12: 909 pg/mL (ref 232–1245)

## 2024-10-01 LAB — HEMOGLOBIN A1C
Est. average glucose Bld gHb Est-mCnc: 114 mg/dL
Hgb A1c MFr Bld: 5.6 % (ref 4.8–5.6)

## 2024-10-01 LAB — LIPID PANEL
Chol/HDL Ratio: 3.9 ratio (ref 0.0–4.4)
Cholesterol, Total: 214 mg/dL — ABNORMAL HIGH (ref 100–199)
HDL: 55 mg/dL
LDL Chol Calc (NIH): 125 mg/dL — ABNORMAL HIGH (ref 0–99)
Triglycerides: 196 mg/dL — ABNORMAL HIGH (ref 0–149)
VLDL Cholesterol Cal: 34 mg/dL (ref 5–40)

## 2024-10-01 LAB — TSH+FREE T4
Free T4: 1.34 ng/dL (ref 0.82–1.77)
TSH: 1.36 u[IU]/mL (ref 0.450–4.500)

## 2024-10-01 LAB — VITAMIN D 25 HYDROXY (VIT D DEFICIENCY, FRACTURES): Vit D, 25-Hydroxy: 69.3 ng/mL (ref 30.0–100.0)

## 2024-10-01 MED ORDER — SUMATRIPTAN SUCCINATE 100 MG PO TABS: ORAL_TABLET | ORAL | 11 refills | Status: AC

## 2025-10-19 ENCOUNTER — Encounter: Admitting: Urgent Care
